# Patient Record
Sex: Female | Born: 1950 | Race: White | Hispanic: No | Marital: Married | Smoking: Former smoker
Health system: Southern US, Community
[De-identification: ages and names within clinical notes are randomized; demographics above are authoritative.]

## PROBLEM LIST (undated history)

## (undated) DIAGNOSIS — E7800 Pure hypercholesterolemia, unspecified: Secondary | ICD-10-CM

## (undated) DIAGNOSIS — I10 Essential (primary) hypertension: Secondary | ICD-10-CM

## (undated) DIAGNOSIS — C9140 Hairy cell leukemia not having achieved remission: Secondary | ICD-10-CM

## (undated) DIAGNOSIS — E119 Type 2 diabetes mellitus without complications: Secondary | ICD-10-CM

## (undated) DIAGNOSIS — Z9289 Personal history of other medical treatment: Secondary | ICD-10-CM

## (undated) HISTORY — PX: ABDOMINAL HYSTERECTOMY: SHX81

## (undated) HISTORY — PX: TONSILLECTOMY: SUR1361

## (undated) HISTORY — DX: Hairy cell leukemia not having achieved remission: C91.40

---

## 2004-08-23 ENCOUNTER — Encounter: Admission: RE | Admit: 2004-08-23 | Discharge: 2004-09-19 | Payer: Self-pay | Admitting: Family Medicine

## 2004-11-23 ENCOUNTER — Ambulatory Visit: Payer: Self-pay | Admitting: Hematology & Oncology

## 2005-05-09 ENCOUNTER — Other Ambulatory Visit: Admission: RE | Admit: 2005-05-09 | Discharge: 2005-05-09 | Payer: Self-pay | Admitting: Obstetrics and Gynecology

## 2005-06-18 ENCOUNTER — Inpatient Hospital Stay (HOSPITAL_COMMUNITY): Admission: RE | Admit: 2005-06-18 | Discharge: 2005-06-20 | Payer: Self-pay | Admitting: Obstetrics and Gynecology

## 2005-06-18 ENCOUNTER — Encounter (INDEPENDENT_AMBULATORY_CARE_PROVIDER_SITE_OTHER): Payer: Self-pay | Admitting: Specialist

## 2005-11-29 ENCOUNTER — Ambulatory Visit: Payer: Self-pay | Admitting: Hematology & Oncology

## 2006-11-28 ENCOUNTER — Ambulatory Visit: Payer: Self-pay | Admitting: Hematology & Oncology

## 2006-12-03 LAB — CBC WITH DIFFERENTIAL/PLATELET
BASO%: 0.2 % (ref 0.0–2.0)
EOS%: 1 % (ref 0.0–7.0)
HGB: 13.7 g/dL (ref 11.6–15.9)
MCH: 30.5 pg (ref 26.0–34.0)
MCHC: 34.1 g/dL (ref 32.0–36.0)
MONO#: 0.3 10*3/uL (ref 0.1–0.9)
RDW: 13 % (ref 11.3–14.5)
WBC: 6.7 10*3/uL (ref 3.9–10.0)
lymph#: 1.2 10*3/uL (ref 0.9–3.3)

## 2007-12-21 ENCOUNTER — Ambulatory Visit: Payer: Self-pay | Admitting: Hematology & Oncology

## 2007-12-23 LAB — CBC WITH DIFFERENTIAL/PLATELET
BASO%: 0.5 % (ref 0.0–2.0)
HCT: 40.9 % (ref 34.8–46.6)
LYMPH%: 21.9 % (ref 14.0–48.0)
MCH: 29.6 pg (ref 26.0–34.0)
MCHC: 33.8 g/dL (ref 32.0–36.0)
MCV: 87.8 fL (ref 81.0–101.0)
MONO%: 5.6 % (ref 0.0–13.0)
NEUT%: 70.1 % (ref 39.6–76.8)
Platelets: 230 10*3/uL (ref 145–400)
RBC: 4.66 10*6/uL (ref 3.70–5.32)

## 2008-11-25 ENCOUNTER — Ambulatory Visit: Payer: Self-pay | Admitting: Hematology & Oncology

## 2008-11-25 LAB — CBC WITH DIFFERENTIAL (CANCER CENTER ONLY)
BASO#: 0 10*3/uL (ref 0.0–0.2)
EOS%: 1.8 % (ref 0.0–7.0)
Eosinophils Absolute: 0.1 10*3/uL (ref 0.0–0.5)
HGB: 14.9 g/dL (ref 11.6–15.9)
LYMPH#: 1.2 10*3/uL (ref 0.9–3.3)
MONO#: 0.2 10*3/uL (ref 0.1–0.9)
NEUT#: 3.3 10*3/uL (ref 1.5–6.5)
RBC: 4.89 10*6/uL (ref 3.70–5.32)
WBC: 4.9 10*3/uL (ref 3.9–10.0)

## 2009-12-04 ENCOUNTER — Ambulatory Visit: Payer: Self-pay | Admitting: Hematology & Oncology

## 2009-12-05 LAB — CBC WITH DIFFERENTIAL (CANCER CENTER ONLY)
BASO#: 0 10*3/uL (ref 0.0–0.2)
EOS%: 1.4 % (ref 0.0–7.0)
HCT: 44.5 % (ref 34.8–46.6)
HGB: 15.1 g/dL (ref 11.6–15.9)
LYMPH%: 20.1 % (ref 14.0–48.0)
MCH: 30.8 pg (ref 26.0–34.0)
MCHC: 33.9 g/dL (ref 32.0–36.0)
MCV: 91 fL (ref 81–101)
MONO%: 4.3 % (ref 0.0–13.0)
NEUT#: 4 10*3/uL (ref 1.5–6.5)
NEUT%: 73.7 % (ref 39.6–80.0)

## 2010-12-07 ENCOUNTER — Ambulatory Visit: Payer: Self-pay | Admitting: Hematology & Oncology

## 2010-12-10 LAB — CBC WITH DIFFERENTIAL (CANCER CENTER ONLY)
BASO%: 0.6 % (ref 0.0–2.0)
LYMPH#: 1.2 10*3/uL (ref 0.9–3.3)
LYMPH%: 19.3 % (ref 14.0–48.0)
MCV: 90 fL (ref 81–101)
MONO#: 0.4 10*3/uL (ref 0.1–0.9)
Platelets: 205 10*3/uL (ref 145–400)
RDW: 10.9 % (ref 10.5–14.6)
WBC: 6 10*3/uL (ref 3.9–10.0)

## 2010-12-10 LAB — COMPREHENSIVE METABOLIC PANEL
ALT: 26 U/L (ref 0–35)
AST: 17 U/L (ref 0–37)
Alkaline Phosphatase: 83 U/L (ref 39–117)
Calcium: 9.2 mg/dL (ref 8.4–10.5)
Chloride: 96 mEq/L (ref 96–112)
Creatinine, Ser: 0.52 mg/dL (ref 0.40–1.20)

## 2010-12-10 LAB — CHCC SATELLITE - SMEAR

## 2011-04-05 NOTE — Op Note (Signed)
NAMEMarland Kitchen  Melanie, Atkins NO.:  [id number]   MEDICAL RECORD NO.:  [id number]          PATIENT TYPE:  INP   LOCATION:  9319                          FACILITY:  [facility]   PHYSICIAN:  Carrington Clamp, M.D. DATE OF BIRTH:  02-09-1951   DATE OF PROCEDURE:  06/18/2005  DATE OF DISCHARGE:                                 OPERATIVE REPORT   PREOPERATIVE DIAGNOSIS:  Symptomatic fibroids and menometrorrhagia.   POSTOPERATIVE DIAGNOSIS:  Symptomatic fibroids and menometrorrhagia.   OPERATION/PROCEDURE:  Total abdominal hysterectomy.   SURGEON:  Carrington Clamp, M.D.   ASSISTANT:  Luvenia Redden, M.D.   ANESTHESIA:  General.   SPECIMENS:  Uterus and cervix.   ESTIMATED BLOOD LOSS:  100 mL.   IV FLUIDS:  1400 mL.   URINARY OUTPUT:  150 mL.   COMPLICATIONS:  None.   FINDINGS:  A 17-week size uterus with fibroids, largest one in the fundus.  The uterus and cervix weighed 650 g.  There were normal ovaries although  there was a small amount of endometriosis in the posterior cul-de-sac along  the posterior cervix which the ovaries and tubes were adherent.  The ureters  were seen bilaterally post procedure.   MEDICATIONS:  Ancef and Gelfoam.   COUNTS:  Correct x3.   DESCRIPTION OF PROCEDURE:  After adequate general anesthesia was achieved,  the patient was prepped and draped in the usual sterile fashion.  Dorsal  supine position.  A Pfannenstiel skin incision was made with the scalpel and  carried down to the fascia with Bovie cautery.  The fascia was incised in  the midline with the scalpel and then carried in transverse curvilinear  manner with the Mayo scissors.  The fascia was directed superiorly and  inferiorly from rectus muscle and the rectus muscle split in the midline.  Bowel free portion of the peritoneum was then entered into bluntly and then  incised in the superior inferior manner with good visualization of the bowel  and bladder.   The  O'Connor-O'Sullivan retractor was placed and the bowel was packed away  with seven wet laps.  The uterus was grasped with a pair of Kelly clamps and  the round ligaments were grasped with the forceps.  Each round ligament was  secured with the suture transfixion stitch of 0 Vicryl and then was incised  with the Bovie cautery.  The Bovie cautery was used to incise the peritoneum  parallel to the infundibulopelvic ligament and then the anterior leaf of the  peritoneum and vesicouterine fascia.  Bladder flap was created with blunt  and sharp dissection until the bladder was out of field of dissection.  On  the left-hand side, avascular portion of the mesosalpinx was entered into  bluntly and then Heaney clamp was placed over the utero-ovarian ligament.  This was divided with a Mayo scissors and secured with the free hand tie of  0 Vicryl followed by a stitch of 0 Vicryl.  The same procedure was performed  on the right-hand side.  The uterine arteries were then skeletonized and  pair of Heaneys placed over  the uterine artery at  the level of the internal  os.  Each pedicle was incised with the Mayo scissors and secured with a  stitch of 0 Vicryl.  An additional bite with a straight Heaney clamp was  then performed and each pedicle was incised with a scalpel.  Each pedicle  was then secured with a stitch of 0 Vicryl.  The uterus was then amputated  at the level of the internal os with the scalpel.  The cervical stump was  grasped with a pair of Kochers and the remaining cardinal ligament was  divided with alternating successive bites with a straight Heaney clamp.  Each bite was divided with a scalpel and secured with a stitch of 0 Vicryl.  At the level of the reflection of the vagina to the cervix and the  uterosacrals, a pair of curved Heaneys was placed.  Each pedicle was incised  with the Mayo scissors and secured with the Heaney stitch of 0 Vicryl.  The  cervix was then amputated with  Jorgenson scissors and the remaining cuff  grasped with a pair of Kochers.  Two additional figure-of-eight stitches of  0 Vicryl were then placed and hemostasis was achieved.   There was some residual oozing in the cul-de-sac on the vaginal side where  the ovaries had been stuck.  Hemostasis was achieved with both Bovie cautery  and Gelfoam.  The ureters were then identified bilaterally and found to be  moving and well out of the field of dissection.  All instruments and laps  were then withdrawn from the abdomen and the peritoneum was closed with a  running stitch of 2-0 Vicryl.  There a small buttonhole, 1 cm, of the fascia  on the upper side of the fascia.  This was secured at the figure-of-eight  stitch of 0 Vicryl.  The rectus muscles were closed with the same peritoneal  stitch in a separate layer.  The fascia was then closed with a running  suture of 0 PDS loops.  The subcutaneous tissue was then irrigated and  closed with an interrupted 2-0 plain gut stitches.  The skin was closed with  staples.  The patient tolerated the procedure well and was returned to the  recovery room in stable condition.      Carrington Clamp, M.D.  Electronically Signed     MH/MEDQ  D:  06/18/2005  T:  06/18/2005  Job:  [id number]

## 2011-04-05 NOTE — Discharge Summary (Signed)
NAMEMarland Kitchen  Atkins, Melanie Atkins            ACCOUNT NO.:  [id number]   MEDICAL RECORD NO.:  [id number]          PATIENT TYPE:  INP   LOCATION:  9319                          [facility]ILITY[facility]  [facility]   PHYSICIAN:  Carrington Clamp, M.D. DATE OF BIRTH:  August 25, 1951   DATE OF ADMISSION:  06/18/2005  DATE OF DISCHARGE:  06/20/2005                                 DISCHARGE SUMMARY   ADMISSION DIAGNOSIS:  Symptomatic enlarged fibroid uterus with severe  menometrorrhagia.   DISCHARGE DIAGNOSIS:  Symptomatic enlarged fibroid uterus with severe  menometrorrhagia.   OPERATION/PROCEDURE:  Total abdominal hysterectomy.   PERTINENT TEST RESULTS:  Preoperative hemoglobin of 11.5, hematocrit 35.8.  Postoperative day hemoglobin 9.2, hematocrit 28.9.   HISTORY AND PHYSICAL:  This is a 60 year old, G1, P1 with significantly  enlarged fibroid uterus and severe menometrorrhagia.  The patient complained  of two periods per month and soaking through pads.  The patient complains of  watery discharge and uses a whole bag of overnight pads at a time.  This has  been happening for 10 years.  She did not skip any periods.  She has no  symptoms of impending menopause.   ALLERGIES:  No known drug allergies.   MEDICATIONS:  Benazepril/hydrochlorothiazide 20/25 one p.o. daily.   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   PAST SURGICAL HISTORY:  Term cesarean section.   GYN HISTORY:  No PID or abnormal Pap smears.   OB HISTORY:  Term cesarean section x1.   HABITS:  Tobacco:  None.   PHYSICAL EXAMINATION:  VITAL SIGNS: Blood pressure 140/88.  HEENT:  Anicteric without lymphadenopathy.  LUNGS:  Clear to auscultation bilaterally.  HEART:  Regular rate and rhythm.  ABDOMEN:  Soft, nontender, nondistended.  ADENOPATHY:  None.  GENITALIA:  Normal external genitalia and vagina.  Cervix was normal.  Uterus was about 17-week size and bulky.  Adnexa were benign.  RECTUM:   Benign.  EXTREMITIES:  Benign.  SKIN:  Benign.   LABORATORY DATA:  Ultrasound showed a 17 x 9 x 11 cm enlarged uterus with an  8.8 cm fibroid in the fundus.  Normal ovaries were seen.   ASSESSMENT:  The patient was to undergo a total abdominal hysterectomy.  The  patient wished to retain her ovaries unless they were frankly abnormal.  All  risks, benefits and alternatives were discussed with the patient.  The  patient was to receive preoperative antibiotics and SCDs during surgery.   HOSPITAL COURSE:  The patient underwent the above-named procedure on June 18, 2005 without complications.  She was eating, ambulating, voiding on  postoperative day #2.  Was sent home with the following:   ACTIVITY:  No driving for two weeks.  No lifting for six weeks.  No sexual  activity for six weeks.  No straining for six weeks.   WOUND CARE:  Keep dry.   DISCHARGE MEDICATIONS:  1.  Percocet 5 mg one p.o. q.4-6h. p.r.n. pain.  2.  Benazepril/hydrochlorothiazide 20/25 mg.  3.  Iron over-the-counter.  4.  Motrin or Advil over-the-counter.  5.  Colace over-the-counter.   FOLLOW UP:  Return to Dr. Henderson Cloud the following day for staples to be  removed.      Carrington Clamp, M.D.  Electronically Signed     MH/MEDQ  D:  07/24/2005  T:  07/25/2005  Job:  [id number]

## 2011-11-25 ENCOUNTER — Telehealth: Payer: Self-pay | Admitting: *Deleted

## 2011-11-25 NOTE — Telephone Encounter (Signed)
Pt called requesting a lab appt for this month as she promised her previous oncologist before she moved down here that she would have her labs checked once a year. Upon reviewing the chart, it was noted that she had an appt for 12/09/11. She stated she wasn't aware but could have forgotten. Explained that it was most likely made a year ago. When her appt times were confirmed she declined her appt with Dr Myna Hidalgo stating, "I will come in if there is something wrong with my labs but otherwise I normally just get a call". Ok to have a lab only appt. Will ask the schedulers to cx her MD appt.   Of note: she is scheduled for an appt to see a GI for screening colonoscopy.

## 2011-11-29 ENCOUNTER — Telehealth: Payer: Self-pay | Admitting: Hematology & Oncology

## 2011-11-29 NOTE — Telephone Encounter (Signed)
Pt called and cx 12/09/11 apt and resch the lab apt only for 12/10/11

## 2011-12-09 ENCOUNTER — Other Ambulatory Visit: Payer: Self-pay | Admitting: Lab

## 2011-12-09 ENCOUNTER — Ambulatory Visit: Payer: Self-pay | Admitting: Hematology & Oncology

## 2011-12-10 ENCOUNTER — Other Ambulatory Visit: Payer: 59 | Admitting: Lab

## 2011-12-10 LAB — CBC WITH DIFFERENTIAL (CANCER CENTER ONLY)
Eosinophils Absolute: 0.1 10*3/uL (ref 0.0–0.5)
HCT: 41.1 % (ref 34.8–46.6)
HGB: 14.5 g/dL (ref 11.6–15.9)
LYMPH%: 22.6 % (ref 14.0–48.0)
MCV: 88 fL (ref 81–101)
MONO#: 0.3 10*3/uL (ref 0.1–0.9)
Platelets: 166 10*3/uL (ref 145–400)
RBC: 4.65 10*6/uL (ref 3.70–5.32)
WBC: 4.5 10*3/uL (ref 3.9–10.0)

## 2011-12-11 LAB — SEDIMENTATION RATE: Sed Rate: 12 mm/hr (ref 0–22)

## 2012-12-16 ENCOUNTER — Telehealth: Payer: Self-pay | Admitting: Hematology & Oncology

## 2012-12-16 NOTE — Telephone Encounter (Signed)
Pt called wanted to make lab appointment only. Per Dr. Myna Hidalgo pt needs to be seen. Pt aware MD wants to see her, she said she wasn't sure why she has been doing this for 6 years or so. I explained that the MD needs to see her once in a while. I offered her several appointment dates but she decided 01-01-13 would work the best for her.

## 2013-01-01 ENCOUNTER — Other Ambulatory Visit: Payer: 59 | Admitting: Lab

## 2013-01-01 ENCOUNTER — Telehealth: Payer: Self-pay | Admitting: *Deleted

## 2013-01-01 ENCOUNTER — Ambulatory Visit: Payer: 59 | Admitting: Hematology & Oncology

## 2013-01-01 NOTE — Telephone Encounter (Signed)
Patient called and left message regarding her appts from [place] office. I have called her back, patient stated that "there's no you here." I have explained that the office was closed, but I offered her a lab appt her at the [place] office. Patient stated that " but I'm here and found someone to draw my labs in the [facility] in the [facility]." I told the patient I will call ber back. Message given to [facility]. No lab orders in the computer, we have asked the patient to reschedule.  JMW

## 2013-01-04 ENCOUNTER — Telehealth: Payer: Self-pay | Admitting: Hematology & Oncology

## 2013-01-04 NOTE — Telephone Encounter (Signed)
Pt cx 2-26 appointment

## 2013-01-04 NOTE — Telephone Encounter (Signed)
Pt called wanted to speak to Darl Pikes said she owed her an apology. I asked her to reschedule while I had her on the phone. She told me twice she would call back to reschedule. I transferred call to Darl Pikes.

## 2013-01-13 ENCOUNTER — Other Ambulatory Visit (HOSPITAL_BASED_OUTPATIENT_CLINIC_OR_DEPARTMENT_OTHER): Payer: 59 | Admitting: Lab

## 2013-01-13 ENCOUNTER — Ambulatory Visit (HOSPITAL_BASED_OUTPATIENT_CLINIC_OR_DEPARTMENT_OTHER): Payer: 59 | Admitting: Hematology & Oncology

## 2013-01-13 VITALS — BP 152/77 | HR 109 | Temp 97.9°F | Resp 16 | Ht 69.0 in | Wt 233.0 lb

## 2013-01-13 DIAGNOSIS — C9140 Hairy cell leukemia not having achieved remission: Secondary | ICD-10-CM

## 2013-01-13 LAB — CBC WITH DIFFERENTIAL (CANCER CENTER ONLY)
Eosinophils Absolute: 0.1 10*3/uL (ref 0.0–0.5)
LYMPH%: 24.4 % (ref 14.0–48.0)
MCV: 88 fL (ref 81–101)
MONO#: 0.4 10*3/uL (ref 0.1–0.9)
NEUT#: 4.2 10*3/uL (ref 1.5–6.5)
Platelets: 213 10*3/uL (ref 145–400)
RBC: 5 10*6/uL (ref 3.70–5.32)
WBC: 6.2 10*3/uL (ref 3.9–10.0)

## 2013-01-13 NOTE — Progress Notes (Signed)
This office note has been dictated.

## 2013-01-15 NOTE — Progress Notes (Signed)
CC:   Juluis Rainier, MD, Fax [phone] Carrington Clamp, M.D.  DIAGNOSIS:  Hairy cell leukemia, in clinical remission.  CURRENT THERAPY:  Observation.  INTERIM HISTORY:  Ms. Ramnath comes in for her followup.  She is doing well.  I think we can probably discharge her from the clinic after this visit.  She has been in remission now for about 12 years.  She is feeling well.  She is having no issues.  She is still working without any problems.  There are no fevers, sweats or chills.  There is no bony pain.  There is no bleeding.  There is no change in bowel or bladder habits.  PHYSICAL EXAMINATION:  General:  This is a well-developed, well- nourished white female in no obvious distress.  Vital signs: Temperature of 97.9, pulse 109, respiratory rate 18, blood pressure 152/77.  Weight is 233.  Head and neck:  Normocephalic, atraumatic skull.  There are no ocular or oral lesions.  There are no palpable cervical or supraclavicular lymph nodes.  Lungs:  Clear bilaterally. Cardiac:  Regular rate and rhythm with a normal S1 and S2.  There are no murmurs, rubs or bruits.  Abdomen:  Soft with good bowel sounds.  There is no palpable abdominal mass.  There is no fluid wave.  There is no palpable hepatosplenomegaly.  Extremities:  No clubbing, cyanosis or edema.  Skin:  No rashes, ecchymoses or petechiae.  LABORATORY STUDIES:  White cell count is 6.2, hemoglobin 15.4, hematocrit 44.2, platelet count 213.  White cell differential shows 68 segs, 25 lymphs.  Peripheral smear shows good maturation of her white blood cells.  I do not see any immature lymphoid cells.  I do not see any "hairy" lymphoid cells.  Red cells with no nucleated red blood cells.  I see no teardrop cells.  There is no rouleaux formation.  Platelets are adequate in number and size.  IMPRESSION:  Ms. Boss is a very nice 62 year old white female with a remote history of hairy cell leukemia.  She is cured.  I do  not think we need to get her back to see [place].  We are just not making any impact on her general medical care.  If there are any hematologic issues down the road, we will be more than happy to see her.    ______________________________ Josph Macho, M.D. PRE/MEDQ  D:  01/13/2013  T:  01/14/2013  Job:  [id number]

## 2013-11-29 ENCOUNTER — Telehealth: Payer: Self-pay | Admitting: Hematology & Oncology

## 2013-11-29 ENCOUNTER — Other Ambulatory Visit: Payer: Self-pay | Admitting: Nurse Practitioner

## 2013-11-29 DIAGNOSIS — C9140 Hairy cell leukemia not having achieved remission: Secondary | ICD-10-CM

## 2013-11-29 NOTE — Telephone Encounter (Signed)
Pt made 1-15 lab ok per RN

## 2013-12-02 ENCOUNTER — Telehealth: Payer: Self-pay | Admitting: Hematology & Oncology

## 2013-12-02 ENCOUNTER — Other Ambulatory Visit: Payer: 59 | Admitting: Lab

## 2013-12-02 NOTE — Telephone Encounter (Signed)
Pt moved 1-15 lab to 1-16

## 2013-12-03 ENCOUNTER — Other Ambulatory Visit (HOSPITAL_BASED_OUTPATIENT_CLINIC_OR_DEPARTMENT_OTHER): Payer: 59 | Admitting: Lab

## 2013-12-03 DIAGNOSIS — C9140 Hairy cell leukemia not having achieved remission: Secondary | ICD-10-CM

## 2013-12-03 LAB — CBC WITH DIFFERENTIAL (CANCER CENTER ONLY)
BASO#: 0 10*3/uL (ref 0.0–0.2)
BASO%: 0.5 % (ref 0.0–2.0)
EOS%: 0.9 % (ref 0.0–7.0)
Eosinophils Absolute: 0.1 10*3/uL (ref 0.0–0.5)
HCT: 43.5 % (ref 34.8–46.6)
HGB: 14.9 g/dL (ref 11.6–15.9)
LYMPH#: 1.4 10*3/uL (ref 0.9–3.3)
LYMPH%: 25.8 % (ref 14.0–48.0)
MCH: 30.3 pg (ref 26.0–34.0)
MCHC: 34.3 g/dL (ref 32.0–36.0)
MCV: 89 fL (ref 81–101)
MONO#: 0.4 10*3/uL (ref 0.1–0.9)
MONO%: 7.2 % (ref 0.0–13.0)
NEUT#: 3.7 10*3/uL (ref 1.5–6.5)
NEUT%: 65.6 % (ref 39.6–80.0)
PLATELETS: 233 10*3/uL (ref 145–400)
RBC: 4.91 10*6/uL (ref 3.70–5.32)
RDW: 12.3 % (ref 11.1–15.7)
WBC: 5.6 10*3/uL (ref 3.9–10.0)

## 2013-12-03 LAB — CHCC SATELLITE - SMEAR

## 2014-11-30 ENCOUNTER — Other Ambulatory Visit: Payer: Self-pay | Admitting: Hematology & Oncology

## 2014-11-30 ENCOUNTER — Telehealth: Payer: Self-pay | Admitting: Hematology & Oncology

## 2014-11-30 ENCOUNTER — Encounter: Payer: Self-pay | Admitting: Hematology & Oncology

## 2014-11-30 DIAGNOSIS — C9140 Hairy cell leukemia not having achieved remission: Secondary | ICD-10-CM

## 2014-11-30 HISTORY — DX: Hairy cell leukemia not having achieved remission: C91.40

## 2014-11-30 NOTE — Telephone Encounter (Signed)
Pt left message wants lab appointment, per MD that's fine. I left pt message to call for appointment

## 2014-11-30 NOTE — Telephone Encounter (Signed)
Pt aware of 1-20 lab

## 2014-12-07 ENCOUNTER — Other Ambulatory Visit (HOSPITAL_BASED_OUTPATIENT_CLINIC_OR_DEPARTMENT_OTHER): Payer: 59 | Admitting: Lab

## 2014-12-07 DIAGNOSIS — C9140 Hairy cell leukemia not having achieved remission: Secondary | ICD-10-CM

## 2014-12-07 LAB — CBC WITH DIFFERENTIAL (CANCER CENTER ONLY)
BASO#: 0 10*3/uL (ref 0.0–0.2)
BASO%: 0.5 % (ref 0.0–2.0)
EOS%: 1.6 % (ref 0.0–7.0)
Eosinophils Absolute: 0.1 10*3/uL (ref 0.0–0.5)
HCT: 41.7 % (ref 34.8–46.6)
HEMOGLOBIN: 14.3 g/dL (ref 11.6–15.9)
LYMPH#: 1.5 10*3/uL (ref 0.9–3.3)
LYMPH%: 26.7 % (ref 14.0–48.0)
MCH: 30.2 pg (ref 26.0–34.0)
MCHC: 34.3 g/dL (ref 32.0–36.0)
MCV: 88 fL (ref 81–101)
MONO#: 0.4 10*3/uL (ref 0.1–0.9)
MONO%: 7.3 % (ref 0.0–13.0)
NEUT#: 3.7 10*3/uL (ref 1.5–6.5)
NEUT%: 63.9 % (ref 39.6–80.0)
Platelets: 242 10*3/uL (ref 145–400)
RBC: 4.74 10*6/uL (ref 3.70–5.32)
RDW: 12.2 % (ref 11.1–15.7)
WBC: 5.8 10*3/uL (ref 3.9–10.0)

## 2014-12-07 LAB — CHCC SATELLITE - SMEAR

## 2015-01-12 ENCOUNTER — Other Ambulatory Visit: Payer: Self-pay | Admitting: Obstetrics and Gynecology

## 2015-01-13 LAB — CYTOLOGY - PAP

## 2017-02-06 ENCOUNTER — Other Ambulatory Visit: Payer: Self-pay | Admitting: Family Medicine

## 2017-02-06 ENCOUNTER — Ambulatory Visit
Admission: RE | Admit: 2017-02-06 | Discharge: 2017-02-06 | Disposition: A | Discharge status: Home / Self Care | Payer: Commercial Managed Care - HMO | Source: Ambulatory Visit | Attending: Family Medicine | Admitting: Family Medicine

## 2017-02-06 DIAGNOSIS — S0993XA Unspecified injury of face, initial encounter: Secondary | ICD-10-CM

## 2017-12-20 IMAGING — CT CT MAXILLOFACIAL W/O CM
1 series · 15 of 30 positions shown, 19 images · non-contrast
Comparison: None.

CLINICAL DATA: Pain following fall onto brick patio

EXAM:
CT MAXILLOFACIAL WITHOUT CONTRAST
TECHNIQUE: Multidetector CT imaging of the maxillofacial structures was
performed. Multiplanar CT image reconstructions were also generated.
A small metallic BB was placed on the right temple in order to
reliably differentiate right from left.

[Series 3: maxofacial soft · axial · 0.32mm/px · z∈[-200,+13]mm · 15 of 77 slices shown, 19 images]
[im 3/77  brain]
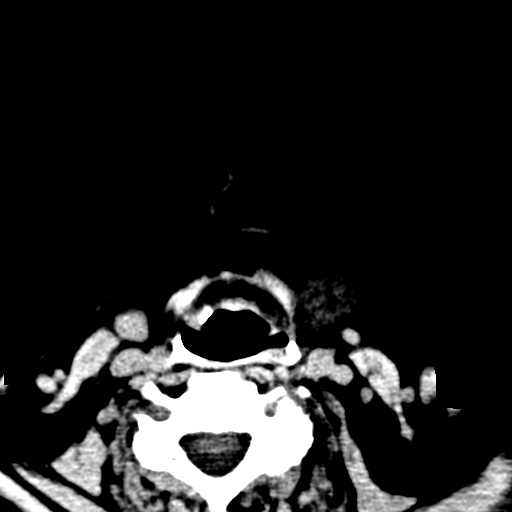
[im 3/77  bone]
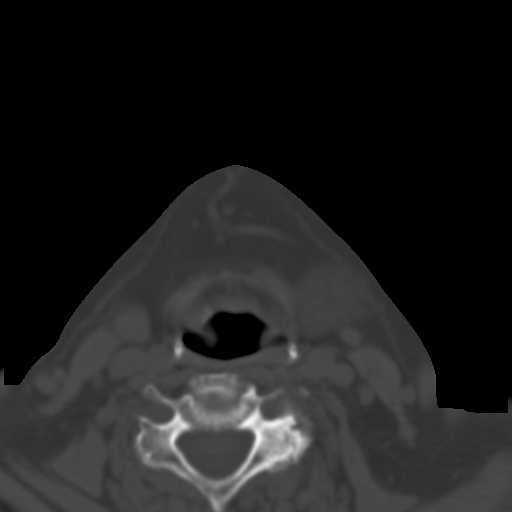
[im 8/77  bone]
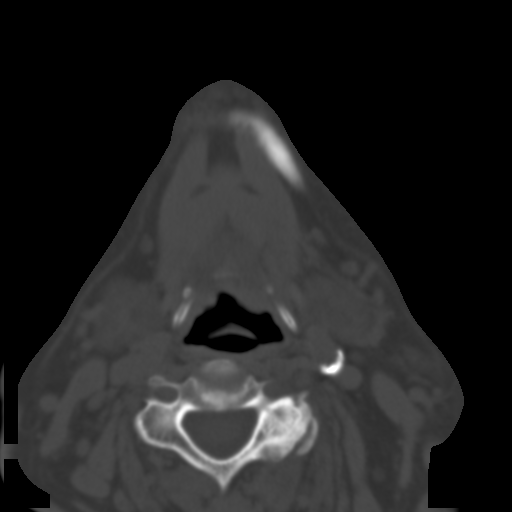
[im 14/77  bone]
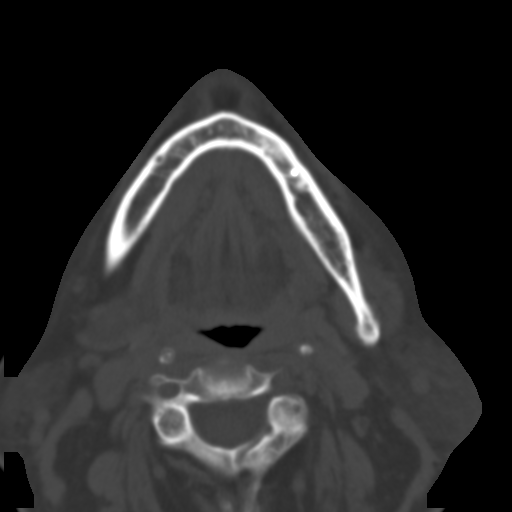
[im 19/77  bone]
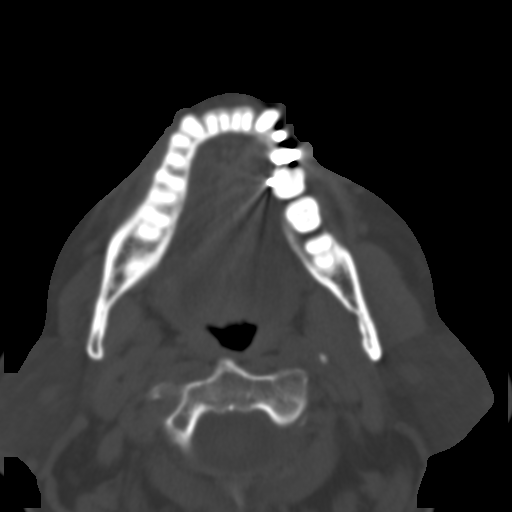
[im 24/77  brain]
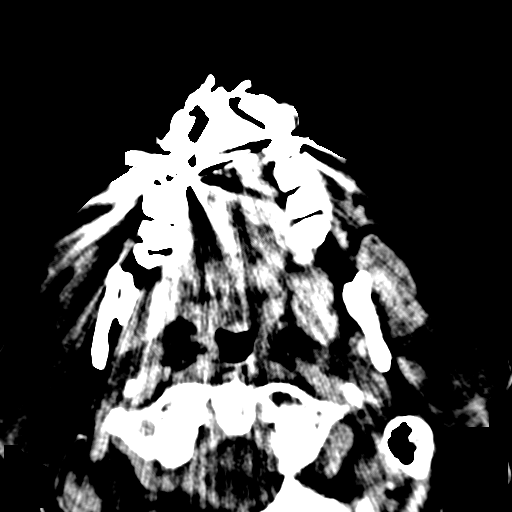
[im 24/77  bone]
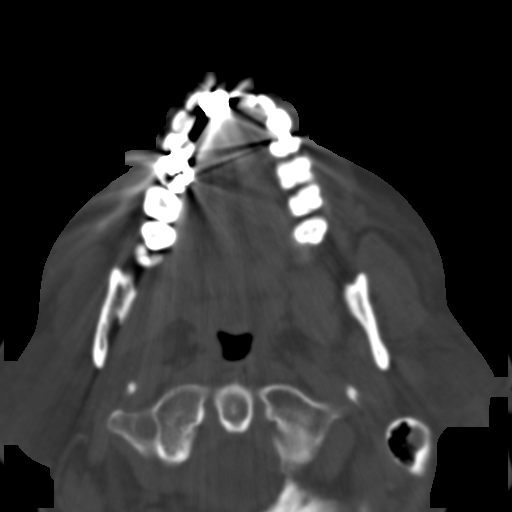
[im 29/77  bone]
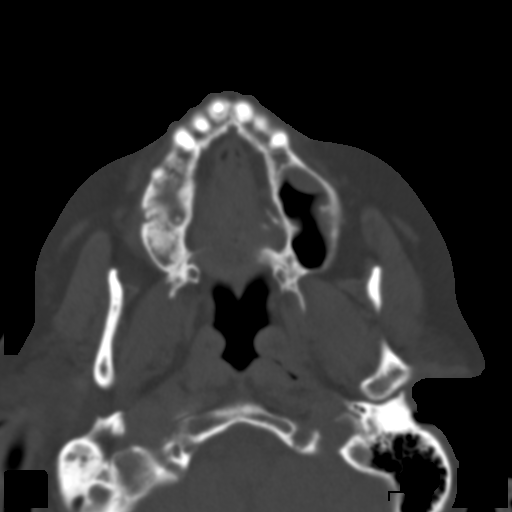
[im 35/77  bone]
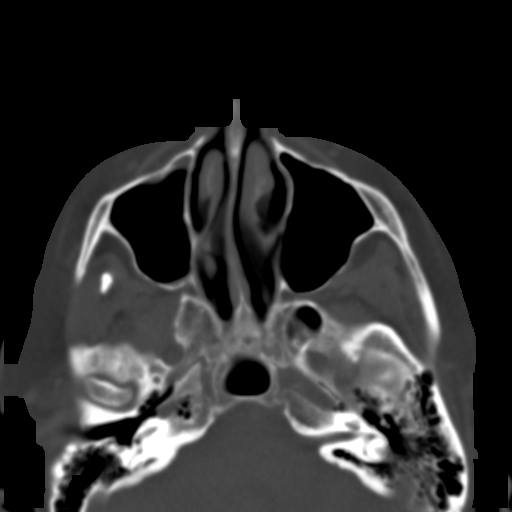
[im 40/77  bone]
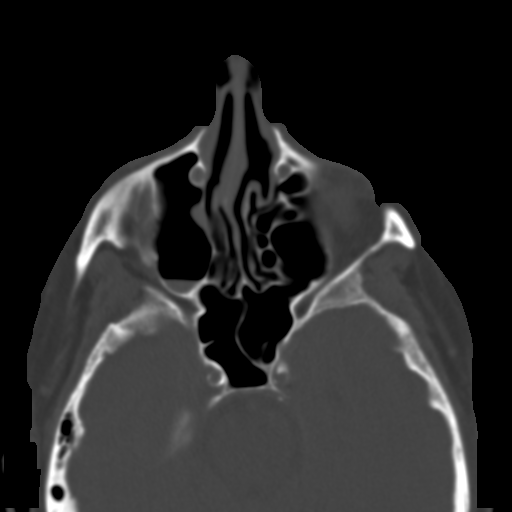
[im 42/77  brain]
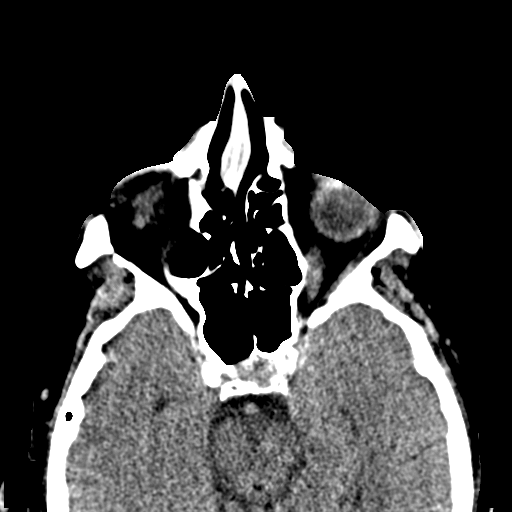
[im 42/77  bone]
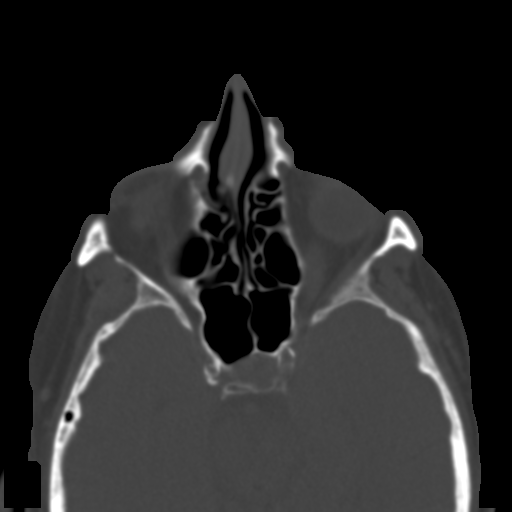
[im 48/77  bone]
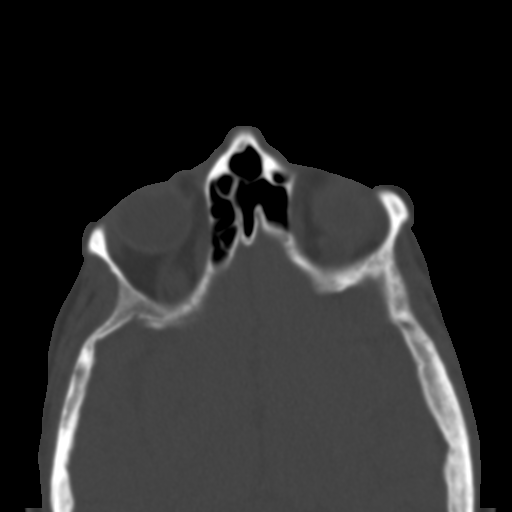
[im 53/77  bone]
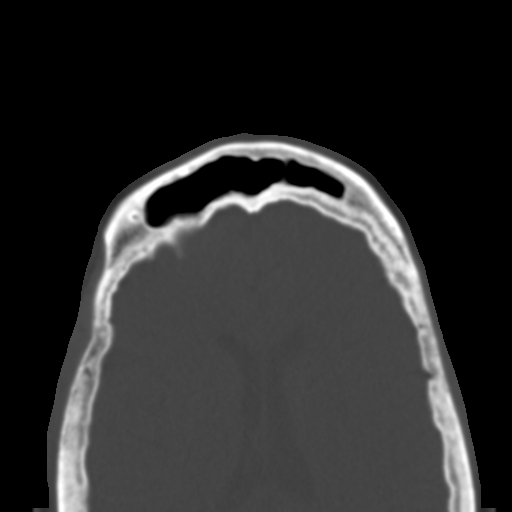
[im 58/77  bone]
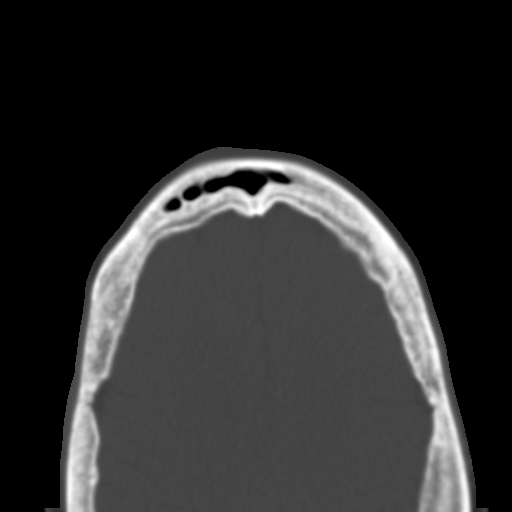
[im 63/77  brain]
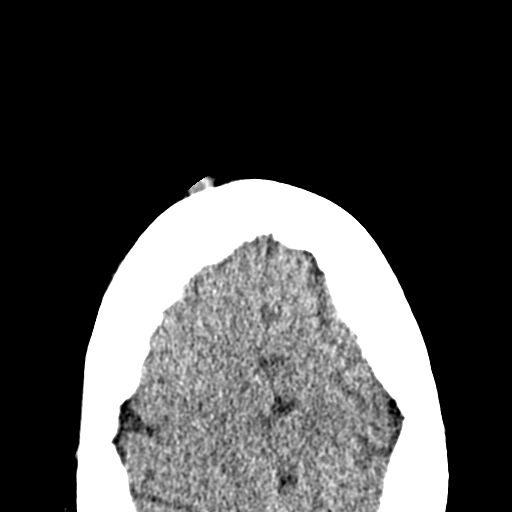
[im 63/77  bone]
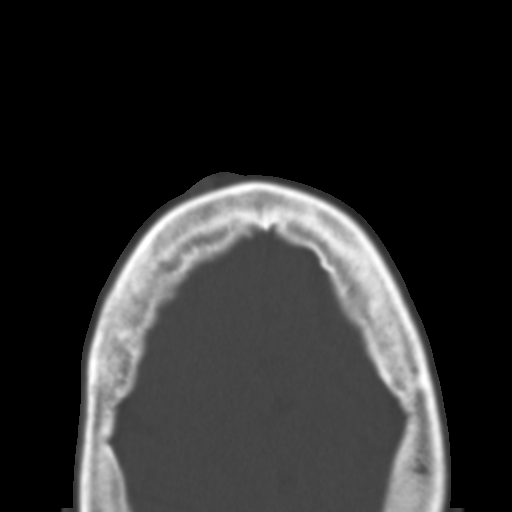
[im 69/77  bone]
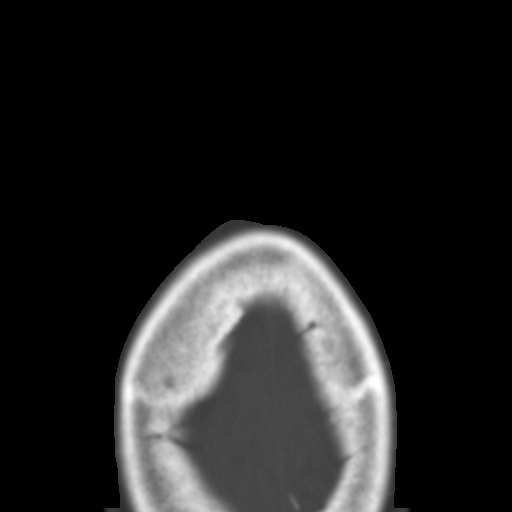
[im 74/77  bone]
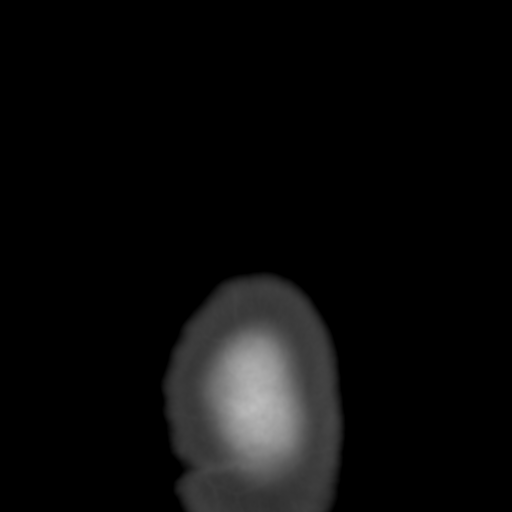

[15 of 30 positions shown; findings below may reference images not displayed]

FINDINGS: Osseous: There is no appreciable fracture or dislocation. There are
no blastic or lytic bone lesions.

Orbits: There is mild preseptal swelling over each orbit. There is
no intraorbital lesion. Globes appear intact bilaterally. No
intraorbital asymmetry or abnormal attenuation within either globe.

Sinuses: There is mucosal thickening in both maxillary antra. There
is a small air-fluid level in the right maxillary antrum. There is
mild mucosal thickening in several ethmoid air cells. No bony
destruction or expansion. There is rightward deviation of the nasal
septum. There is a bony spur arising from the rightward aspect of
the nasal septum. No nares obstruction. Ostiomeatal unit complexes
are patent bilaterally.

Soft tissues: There is soft tissue swelling over the upper face on
each side. No soft tissue mass or abscess evident. Salivary glands
appear symmetric bilaterally. No adenopathy. Tongue and tongue base
regions appear normal. Visualize pharynx appears normal.

Mastoid air cells which are visualized are clear.

Limited intracranial: There is a right frontal scalp hematoma more
superiorly. No calvarial fracture in visualized regions. Brain
parenchyma appears normal in visualized regions. Note that there is
extensive calcification in each carotid siphon region.
IMPRESSION: Areas of soft tissue swelling without fracture. Areas of paranasal
sinus disease with small air-fluid level in right maxillary antrum.
No intraorbital lesions. Visualized brain parenchyma appears normal.
Calcification in each carotid siphon region noted.

## 2018-01-27 ENCOUNTER — Inpatient Hospital Stay (HOSPITAL_COMMUNITY): Payer: 59

## 2018-01-27 ENCOUNTER — Encounter (HOSPITAL_COMMUNITY): Payer: Self-pay | Admitting: Family Medicine

## 2018-01-27 ENCOUNTER — Inpatient Hospital Stay (HOSPITAL_COMMUNITY)
Admission: AD | Admit: 2018-01-27 | Discharge: 2018-01-31 | DRG: 617 | Disposition: A | Discharge status: Home / Self Care | Payer: 59 | Source: Ambulatory Visit | Attending: Nephrology | Admitting: Nephrology

## 2018-01-27 DIAGNOSIS — M79674 Pain in right toe(s): Secondary | ICD-10-CM | POA: Diagnosis present

## 2018-01-27 DIAGNOSIS — Z90710 Acquired absence of both cervix and uterus: Secondary | ICD-10-CM | POA: Diagnosis not present

## 2018-01-27 DIAGNOSIS — I10 Essential (primary) hypertension: Secondary | ICD-10-CM | POA: Diagnosis present

## 2018-01-27 DIAGNOSIS — E1151 Type 2 diabetes mellitus with diabetic peripheral angiopathy without gangrene: Secondary | ICD-10-CM | POA: Diagnosis present

## 2018-01-27 DIAGNOSIS — Z6836 Body mass index (BMI) 36.0-36.9, adult: Secondary | ICD-10-CM | POA: Diagnosis not present

## 2018-01-27 DIAGNOSIS — E1165 Type 2 diabetes mellitus with hyperglycemia: Secondary | ICD-10-CM | POA: Diagnosis not present

## 2018-01-27 DIAGNOSIS — E7800 Pure hypercholesterolemia, unspecified: Secondary | ICD-10-CM | POA: Diagnosis present

## 2018-01-27 DIAGNOSIS — E1140 Type 2 diabetes mellitus with diabetic neuropathy, unspecified: Secondary | ICD-10-CM | POA: Diagnosis present

## 2018-01-27 DIAGNOSIS — E11628 Type 2 diabetes mellitus with other skin complications: Secondary | ICD-10-CM | POA: Diagnosis present

## 2018-01-27 DIAGNOSIS — E1169 Type 2 diabetes mellitus with other specified complication: Principal | ICD-10-CM | POA: Diagnosis present

## 2018-01-27 DIAGNOSIS — E669 Obesity, unspecified: Secondary | ICD-10-CM | POA: Diagnosis present

## 2018-01-27 DIAGNOSIS — Z87891 Personal history of nicotine dependence: Secondary | ICD-10-CM

## 2018-01-27 DIAGNOSIS — L03115 Cellulitis of right lower limb: Secondary | ICD-10-CM | POA: Diagnosis present

## 2018-01-27 DIAGNOSIS — Z856 Personal history of leukemia: Secondary | ICD-10-CM | POA: Diagnosis not present

## 2018-01-27 DIAGNOSIS — M869 Osteomyelitis, unspecified: Secondary | ICD-10-CM | POA: Diagnosis not present

## 2018-01-27 DIAGNOSIS — M86271 Subacute osteomyelitis, right ankle and foot: Secondary | ICD-10-CM

## 2018-01-27 DIAGNOSIS — M86371 Chronic multifocal osteomyelitis, right ankle and foot: Secondary | ICD-10-CM | POA: Diagnosis not present

## 2018-01-27 DIAGNOSIS — L97519 Non-pressure chronic ulcer of other part of right foot with unspecified severity: Secondary | ICD-10-CM | POA: Diagnosis present

## 2018-01-27 DIAGNOSIS — E11621 Type 2 diabetes mellitus with foot ulcer: Secondary | ICD-10-CM | POA: Diagnosis present

## 2018-01-27 DIAGNOSIS — L089 Local infection of the skin and subcutaneous tissue, unspecified: Secondary | ICD-10-CM | POA: Diagnosis not present

## 2018-01-27 DIAGNOSIS — Z7982 Long term (current) use of aspirin: Secondary | ICD-10-CM

## 2018-01-27 DIAGNOSIS — Z7984 Long term (current) use of oral hypoglycemic drugs: Secondary | ICD-10-CM | POA: Diagnosis not present

## 2018-01-27 HISTORY — DX: Type 2 diabetes mellitus without complications: E11.9

## 2018-01-27 HISTORY — DX: Personal history of other medical treatment: Z92.89

## 2018-01-27 HISTORY — DX: Essential (primary) hypertension: I10

## 2018-01-27 HISTORY — DX: Pure hypercholesterolemia, unspecified: E78.00

## 2018-01-27 LAB — GLUCOSE, CAPILLARY
GLUCOSE-CAPILLARY: 184 mg/dL — AB (ref 65–99)
GLUCOSE-CAPILLARY: 202 mg/dL — AB (ref 65–99)

## 2018-01-27 MED ORDER — ONDANSETRON HCL 4 MG PO TABS: 4.0000 mg | ORAL_TABLET | Freq: Four times a day (QID) | ORAL | Status: DC | PRN

## 2018-01-27 MED ORDER — ACETAMINOPHEN 325 MG PO TABS: 650.0000 mg | ORAL_TABLET | Freq: Four times a day (QID) | ORAL | Status: DC | PRN

## 2018-01-27 MED ORDER — ONDANSETRON HCL 4 MG/2ML IJ SOLN: 4.0000 mg | Freq: Four times a day (QID) | INTRAMUSCULAR | Status: DC | PRN

## 2018-01-27 MED ORDER — SENNOSIDES-DOCUSATE SODIUM 8.6-50 MG PO TABS: 1.0000 | ORAL_TABLET | Freq: Every evening | ORAL | Status: DC | PRN

## 2018-01-27 MED ORDER — ACETAMINOPHEN 650 MG RE SUPP: 650.0000 mg | Freq: Four times a day (QID) | RECTAL | Status: DC | PRN

## 2018-01-27 MED ORDER — OMEGA-3-ACID ETHYL ESTERS 1 G PO CAPS
1.0000 g | ORAL_CAPSULE | Freq: Every day | ORAL | Status: DC
  Administered 2018-01-28 – 2018-01-31 (×3): 1 g via ORAL
  Filled 2018-01-27 (×3): qty 1

## 2018-01-27 MED ORDER — INSULIN ASPART 100 UNIT/ML ~~LOC~~ SOLN
0.0000 [IU] | SUBCUTANEOUS | Status: DC
  Administered 2018-01-28 (×2): 2 [IU] via SUBCUTANEOUS
  Administered 2018-01-28: 3 [IU] via SUBCUTANEOUS
  Administered 2018-01-28: 2 [IU] via SUBCUTANEOUS

## 2018-01-27 MED ORDER — HYDROCODONE-ACETAMINOPHEN 5-325 MG PO TABS: 1.0000 | ORAL_TABLET | ORAL | Status: DC | PRN

## 2018-01-27 MED ORDER — HYDRALAZINE HCL 20 MG/ML IJ SOLN: 10.0000 mg | INTRAMUSCULAR | Status: DC | PRN

## 2018-01-27 NOTE — H&P (Addendum)
History and Physical    Melanie Atkins NTZ:[id number] DOB: October 11, 1951 DOA: 01/27/2018  PCP: Aretta Nip, MD   Patient coming from: Home, by way of PCP office   Chief Complaint: Foot ulcer with swelling and drainage   HPI: Melanie Atkins is a 67 y.o. female with medical history significant for hypertension and  type 2 diabetes mellitus, now presenting to the hospital as a direct admission from her PCP clinic where she was evaluated for toe ulcer.  Patient reports that she had been in her usual state of health until noticing a wound on her right second toe approximately 2 weeks ago.  Since that time, there has been increasing swelling, erythema, and drainage.  She denies fevers or chills.  Denies any other wounds or rash.  She has a diabetic neuropathy and denies pain.  She was evaluated at her PCPs clinic, afebrile and hemodynamically stable, and direct admission was arranged for further evaluation and management of diabetic foot wound with concern for underlying osteomyelitis.   Review of Systems:  All other systems reviewed and apart from HPI, are negative.  Past Medical History:  Diagnosis Date  . Diabetes mellitus without complication ([place])   . Hairy cell leukemia ([place]) 11/30/2014  . Hypertension     History reviewed. No pertinent surgical history.   has no tobacco, alcohol, and drug history on file.  No Known Allergies  History reviewed. No pertinent family history.   Prior to Admission medications   Medication Sig Start Date End Date Taking? Authorizing Provider  benazepril-hydrochlorthiazide (LOTENSIN HCT) 20-25 MG per tablet Take 1 tablet by mouth daily.  11/26/12   [provider]  GLUCOS-CHONDROIT-CA-MG-C-D PO Take by mouth every morning.    [provider]  METFORMIN HCL PO Take by mouth every morning.    [provider]  Omega-3 Fatty Acids (FISH OIL) 1000 MG CAPS Take by mouth every morning.    [provider]     Physical Exam: Vitals:   01/27/18 2243  BP: (!) 105/55  Pulse: 88  Resp: 18  Temp: 98.4 F (36.9 C)  TempSrc: Oral  SpO2: 96%  Weight: 110.1 kg (242 lb 11.6 oz)  Height: '5\' 8"'  (1.727 m)      Constitutional: NAD, calm, obese Eyes: PERTLA, lids and conjunctivae normal ENMT: Mucous membranes are moist. Posterior pharynx clear of any exudate or lesions.   Neck: normal, supple, no masses, no thyromegaly Respiratory: clear to auscultation bilaterally, no wheezing, no crackles. Normal respiratory effort.   Cardiovascular: S1 & S2 heard, regular rate and rhythm. 1+ pretibial edema bilaterally. No significant JVD. Abdomen: No distension, no tenderness, no masses palpated. Bowel sounds normal.  Musculoskeletal: no clubbing / cyanosis. No joint deformity upper and lower extremities. Right foot findings below.  Skin: second toe on right has ulceration at lateral aspect and distal tip with edema, surrounding erythema, and thick white drainage; there is interdigital maceration. Skin otherwise warm, dry, well-perfused. Neurologic: CN 2-12 grossly intact. Strength 5/5 in all 4 limbs.  Psychiatric: Alert and oriented x 3. Calm, cooperative.      Labs on Admission: I have personally reviewed following labs and imaging studies  CBC: No results for input(s): WBC, NEUTROABS, HGB, HCT, MCV, PLT in the last 168 hours. Basic Metabolic Panel: No results for input(s): NA, K, CL, CO2, GLUCOSE, BUN, CREATININE, CALCIUM, MG, PHOS in the last 168 hours. GFR: CrCl cannot be calculated (Patient's most recent lab result is older than the maximum 21 days allowed.).  Liver Function Tests: No results for input(s): AST, ALT, ALKPHOS, BILITOT, PROT, ALBUMIN in the last 168 hours. No results for input(s): LIPASE, AMYLASE in the last 168 hours. No results for input(s): AMMONIA in the last 168 hours. Coagulation Profile: No results for input(s): INR, PROTIME in the last 168 hours. Cardiac Enzymes: No  results for input(s): CKTOTAL, CKMB, CKMBINDEX, TROPONINI in the last 168 hours. BNP (last 3 results) No results for input(s): PROBNP in the last 8760 hours. HbA1C: No results for input(s): HGBA1C in the last 72 hours. CBG: No results for input(s): GLUCAP in the last 168 hours. Lipid Profile: No results for input(s): CHOL, HDL, LDLCALC, TRIG, CHOLHDL, LDLDIRECT in the last 72 hours. Thyroid Function Tests: No results for input(s): TSH, T4TOTAL, FREET4, T3FREE, THYROIDAB in the last 72 hours. Anemia Panel: No results for input(s): VITAMINB12, FOLATE, FERRITIN, TIBC, IRON, RETICCTPCT in the last 72 hours. Urine analysis: No results found for: COLORURINE, APPEARANCEUR, LABSPEC, PHURINE, GLUCOSEU, HGBUR, BILIRUBINUR, KETONESUR, PROTEINUR, UROBILINOGEN, NITRITE, LEUKOCYTESUR Sepsis Labs: '@LABRCNTIP' (procalcitonin:4,lacticidven:4) )No results found for this or any previous visit (from the past 240 hour(s)).   Radiological Exams on Admission: No results found.  EKG: Not performed.   Assessment/Plan   1. Diabetic foot infection  - Presents with ulceration to right second toe with increasing edema, surrounding erythema, and thick white/yellow drainage  - She is afebrile on arrival  - Concern for underlying osteomyelitis  - Check CBC, ESR, CRP, and lactic acid; collect cultures; start with plain films and consider advanced imaging as needed  - Check ABI given uncontrolled DM   - Follow-up pending labs and consider holding abx to increase yield of surgical cultures    2. Type II DM  - A1c reportedly ~9%  - Managed at home with metformin, held on admission  - Follow CBG's and start SSI with Novolog while in hospital    3. Hypertension  - BP 105/55 initially, will hold her benazepril-HCTZ initially    DVT prophylaxis: SCD's  Code Status: Full  Family Communication: Discussed with patient  Consults called: None Admission status: Inpatient    Vianne Bulls, MD [facility] Pager [phone]  If 7PM-7AM, please contact night-coverage [email or web address] Password [facility]  01/27/2018, 11:19 PM

## 2018-01-28 ENCOUNTER — Inpatient Hospital Stay (HOSPITAL_COMMUNITY): Payer: 59

## 2018-01-28 ENCOUNTER — Encounter (HOSPITAL_COMMUNITY): Payer: Self-pay | Admitting: General Practice

## 2018-01-28 ENCOUNTER — Other Ambulatory Visit: Payer: Self-pay

## 2018-01-28 DIAGNOSIS — E11628 Type 2 diabetes mellitus with other skin complications: Secondary | ICD-10-CM

## 2018-01-28 DIAGNOSIS — M869 Osteomyelitis, unspecified: Secondary | ICD-10-CM

## 2018-01-28 LAB — CBC WITH DIFFERENTIAL/PLATELET
BASOS PCT: 0 %
Basophils Absolute: 0 10*3/uL (ref 0.0–0.1)
Eosinophils Absolute: 0.1 10*3/uL (ref 0.0–0.7)
Eosinophils Relative: 2 %
HCT: 38.2 % (ref 36.0–46.0)
Hemoglobin: 12.6 g/dL (ref 12.0–15.0)
Lymphocytes Relative: 29 %
Lymphs Abs: 2 10*3/uL (ref 0.7–4.0)
MCH: 29.5 pg (ref 26.0–34.0)
MCHC: 33 g/dL (ref 30.0–36.0)
MCV: 89.5 fL (ref 78.0–100.0)
MONO ABS: 0.5 10*3/uL (ref 0.1–1.0)
MONOS PCT: 8 %
Neutro Abs: 4.3 10*3/uL (ref 1.7–7.7)
Neutrophils Relative %: 61 %
Platelets: 338 10*3/uL (ref 150–400)
RBC: 4.27 MIL/uL (ref 3.87–5.11)
RDW: 12.7 % (ref 11.5–15.5)
WBC: 6.9 10*3/uL (ref 4.0–10.5)

## 2018-01-28 LAB — COMPREHENSIVE METABOLIC PANEL
ALK PHOS: 102 U/L (ref 38–126)
ALT: 30 U/L (ref 14–54)
AST: 25 U/L (ref 15–41)
Albumin: 3 g/dL — ABNORMAL LOW (ref 3.5–5.0)
Anion gap: 15 (ref 5–15)
BUN: 12 mg/dL (ref 6–20)
CALCIUM: 9.3 mg/dL (ref 8.9–10.3)
CO2: 25 mmol/L (ref 22–32)
Chloride: 96 mmol/L — ABNORMAL LOW (ref 101–111)
Creatinine, Ser: 0.64 mg/dL (ref 0.44–1.00)
GFR calc non Af Amer: >60 mL/min (ref >60)
Glucose, Bld: 180 mg/dL — ABNORMAL HIGH (ref 65–99)
Potassium: 3.7 mmol/L (ref 3.5–5.1)
SODIUM: 136 mmol/L (ref 135–145)
Total Bilirubin: 0.6 mg/dL (ref 0.3–1.2)
Total Protein: 7 g/dL (ref 6.5–8.1)

## 2018-01-28 LAB — GLUCOSE, CAPILLARY
GLUCOSE-CAPILLARY: 256 mg/dL — AB (ref 65–99)
Glucose-Capillary: 183 mg/dL — ABNORMAL HIGH (ref 65–99)
Glucose-Capillary: 191 mg/dL — ABNORMAL HIGH (ref 65–99)
Glucose-Capillary: 212 mg/dL — ABNORMAL HIGH (ref 65–99)
Glucose-Capillary: 227 mg/dL — ABNORMAL HIGH (ref 65–99)

## 2018-01-28 LAB — HEMOGLOBIN A1C
Hgb A1c MFr Bld: 9.5 % — ABNORMAL HIGH (ref 4.8–5.6)
MEAN PLASMA GLUCOSE: 225.95 mg/dL

## 2018-01-28 LAB — LACTIC ACID, PLASMA
LACTIC ACID, VENOUS: 2.3 mmol/L — AB (ref 0.5–1.9)
LACTIC ACID, VENOUS: 1.1 mmol/L (ref 0.5–1.9)

## 2018-01-28 LAB — C-REACTIVE PROTEIN: CRP: 6.5 mg/dL — ABNORMAL HIGH (ref <1.0)

## 2018-01-28 LAB — SEDIMENTATION RATE: Sed Rate: 92 mm/hr — ABNORMAL HIGH (ref 0–22)

## 2018-01-28 LAB — HIV ANTIBODY (ROUTINE TESTING W REFLEX): HIV Screen 4th Generation wRfx: NONREACTIVE

## 2018-01-28 LAB — PREALBUMIN: Prealbumin: 17.5 mg/dL — ABNORMAL LOW (ref 18–38)

## 2018-01-28 MED ORDER — SODIUM CHLORIDE 0.9 % IV BOLUS (SEPSIS)
500.0000 mL | Freq: Once | INTRAVENOUS | Status: AC
  Administered 2018-01-28: 500 mL via INTRAVENOUS

## 2018-01-28 MED ORDER — POTASSIUM CHLORIDE IN NACL 20-0.9 MEQ/L-% IV SOLN
INTRAVENOUS | Status: AC
  Administered 2018-01-28: 05:00:00 via INTRAVENOUS
  Filled 2018-01-28: qty 1000

## 2018-01-28 MED ORDER — BENAZEPRIL HCL 5 MG PO TABS
20.0000 mg | ORAL_TABLET | Freq: Every day | ORAL | Status: DC
  Administered 2018-01-28 – 2018-01-31 (×3): 20 mg via ORAL
  Filled 2018-01-28 (×4): qty 1
  Filled 2018-01-28: qty 4

## 2018-01-28 MED ORDER — INSULIN DETEMIR 100 UNIT/ML ~~LOC~~ SOLN
10.0000 [IU] | Freq: Every day | SUBCUTANEOUS | Status: DC
  Administered 2018-01-28: 10 [IU] via SUBCUTANEOUS
  Filled 2018-01-28 (×2): qty 0.1

## 2018-01-28 MED ORDER — PREMIER PROTEIN SHAKE
11.0000 [oz_av] | Freq: Two times a day (BID) | ORAL | Status: DC
  Administered 2018-01-28 – 2018-01-29 (×3): 11 [oz_av] via ORAL
  Filled 2018-01-28 (×10): qty 325.31

## 2018-01-28 MED ORDER — HYDROCHLOROTHIAZIDE 25 MG PO TABS
25.0000 mg | ORAL_TABLET | Freq: Every day | ORAL | Status: DC
  Administered 2018-01-29: 25 mg via ORAL
  Filled 2018-01-28: qty 1

## 2018-01-28 MED ORDER — BENAZEPRIL-HYDROCHLOROTHIAZIDE 20-25 MG PO TABS: 1.0000 | ORAL_TABLET | Freq: Every day | ORAL | Status: DC

## 2018-01-28 MED ORDER — INSULIN ASPART 100 UNIT/ML ~~LOC~~ SOLN
0.0000 [IU] | Freq: Every day | SUBCUTANEOUS | Status: DC
  Administered 2018-01-28 – 2018-01-30 (×2): 3 [IU] via SUBCUTANEOUS

## 2018-01-28 MED ORDER — INSULIN ASPART 100 UNIT/ML ~~LOC~~ SOLN
0.0000 [IU] | Freq: Three times a day (TID) | SUBCUTANEOUS | Status: DC
  Administered 2018-01-28 – 2018-01-29 (×3): 5 [IU] via SUBCUTANEOUS
  Administered 2018-01-29: 8 [IU] via SUBCUTANEOUS
  Administered 2018-01-30: 3 [IU] via SUBCUTANEOUS
  Administered 2018-01-31: 5 [IU] via SUBCUTANEOUS

## 2018-01-28 NOTE — Progress Notes (Signed)
Initial Nutrition Assessment  DOCUMENTATION CODES:   Obesity unspecified  INTERVENTION:  Premier Protein BID, each supplement provides 160 calories and 30 grams of protein  NUTRITION DIAGNOSIS:   Increased nutrient needs related to wound healing as evidenced by estimated needs.  GOAL:   Patient will meet greater than or equal to 90% of their needs  MONITOR:   Supplement acceptance, I & O's, Labs, PO intake, Skin, Weight trends  REASON FOR ASSESSMENT:   Consult Wound healing  ASSESSMENT:   Melanie Atkins is a 67 y.o. female who presents with over a 2-week history of ulcerations swelling draining right foot second toe. Plan for second ray amputation of R foot on friday  Spoke with Melanie Atkins at bedside. She reports good appetite and PO intake, eating 3 meals a day PTA, no weight loss. She is open to consuming premier BID to help with wound healing. No other needs. Monitor PO following surgery.  Labs reviewed:  CBGs 212, 191, 183  Medications reviewed and include:  Insulin, Omega 3s,   NUTRITION - FOCUSED PHYSICAL EXAM:    Most Recent Value  Orbital Region  No depletion  Upper Arm Region  No depletion  Thoracic and Lumbar Region  No depletion  Buccal Region  No depletion  Temple Region  No depletion  Clavicle Bone Region  No depletion  Clavicle and Acromion Bone Region  No depletion  Scapular Bone Region  No depletion  Dorsal Hand  No depletion  Patellar Region  No depletion  Anterior Thigh Region  No depletion  Posterior Calf Region  No depletion  Edema (RD Assessment)  None       Diet Order:  Diet Carb Modified Fluid consistency: Thin; Room service appropriate? Yes  EDUCATION NEEDS:   Education needs have been addressed  Skin:  Skin Assessment: Skin Integrity Issues: Skin Integrity Issues:: Diabetic Ulcer Diabetic Ulcer: R toe  Last BM:  01/27/2018  Height:   Ht Readings from Last 1 Encounters:  01/27/18 5\' 8"  (1.727 m)    Weight:   Wt  Readings from Last 1 Encounters:  01/27/18 242 lb 11.6 oz (110.1 kg)    Ideal Body Weight:  63.63 kg  BMI:  Body mass index is 36.91 kg/m.  Estimated Nutritional Needs:   Kcal:  9735-3299 calories (IBW x25-30)  Protein:  90-110 grams  Fluid:  1.9-2.2L  Satira Anis. Paisley Grajeda, MS, RD LDN Inpatient Clinical Dietitian Pager [phone]

## 2018-01-28 NOTE — Consult Note (Signed)
Reason for Consult:Toe ulcer Referring Physician: N Dhungel  [name] is an 67 y.o. female with DM and HTN. HPI: Melanie Atkins has been suffering with a right 2nd toe ulcer for about 2 weeks. At some point it started to have some discharge. She denies any pain or previous episodes of foot ulcers. She did have one day where she felt rundown and had a fever of 100 but otherwise no other constitutional symptoms of infection. Her blood sugars run in the upper 100's at home.  Past Medical History:  Diagnosis Date  . Diabetes mellitus without complication ([place])   . 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 Hypertension     History reviewed. No pertinent surgical history.  History reviewed. No pertinent family history.  Social History:  has no tobacco, alcohol, and drug history on file.  Allergies: No Known Allergies  Medications: I have reviewed the patient's current medications.  Results for orders placed or performed during the hospital encounter of 01/27/18 (from the past 48 hour(s))  Glucose, capillary     Status: Abnormal   Collection Time: 01/27/18 10:48 PM  Result Value Ref Range   Glucose-Capillary 202 (H) 65 - 99 mg/dL  Glucose, capillary     Status: Abnormal   Collection Time: 01/27/18 11:50 PM  Result Value Ref Range   Glucose-Capillary 184 (H) 65 - 99 mg/dL  Hemoglobin A1c     Status: Abnormal   Collection Time: 01/28/18  1:02 AM  Result Value Ref Range   Hgb A1c MFr Bld 9.5 (H) 4.8 - 5.6 %    Comment: (NOTE) Pre diabetes:          5.7%-6.4% Diabetes:              >6.4% Glycemic control for   <7.0% adults with diabetes    Mean Plasma Glucose 225.95 mg/dL    Comment: Performed at [facility], [place] [address] [place], [place] [address]  Sedimentation rate     Status: Abnormal   Collection Time: 01/28/18  1:02 AM  Result Value Ref Range   Sed Rate 92 (H) 0 - 22 mm/hr    Comment: Performed at [facility] [address], [place], [place] [address]   C-reactive protein     Status: Abnormal   Collection Time: 01/28/18  1:02 AM  Result Value Ref Range   CRP 6.5 (H) <1.0 mg/dL    Comment: Performed at [facility] [address] [place], [place] [address]  Prealbumin     Status: Abnormal   Collection Time: 01/28/18  1:02 AM  Result Value Ref Range   Prealbumin 17.5 (L) 18 - 38 mg/dL    Comment: Performed at [facility] [address], [place], [place] [address]  Comprehensive metabolic panel     Status: Abnormal   Collection Time: 01/28/18  1:02 AM  Result Value Ref Range   Sodium 136 135 - 145 mmol/L   Potassium 3.7 3.5 - 5.1 mmol/L   Chloride 96 (L) 101 - 111 mmol/L   CO2 25 22 - 32 mmol/L   Glucose, Bld 180 (H) 65 - 99 mg/dL   BUN 12 6 - 20 mg/dL   Creatinine, Ser 0.64 0.44 - 1.00 mg/dL   Calcium 9.3 8.9 - 10.3 mg/dL   Total Protein 7.0 6.5 - 8.1 g/dL   Albumin 3.0 (L) 3.5 - 5.0 g/dL   AST 25 15 - 41 U/L   ALT 30 14 -  54 U/L   Alkaline Phosphatase 102 38 - 126 U/L   Total Bilirubin 0.6 0.3 - 1.2 mg/dL   GFR calc non Af Amer >60 >60 mL/min   GFR calc Af Amer >60 >60 mL/min    Comment: (NOTE) The eGFR has been calculated using the CKD EPI equation. This calculation has not been validated in all clinical situations. eGFR's persistently <60 mL/min signify possible Chronic Kidney Disease.    Anion gap 15 5 - 15    Comment: Performed at [facility] [address] [place], [place] [address]  CBC WITH DIFFERENTIAL     Status: None   Collection Time: 01/28/18  1:02 AM  Result Value Ref Range   WBC 6.9 4.0 - 10.5 K/uL   RBC 4.27 3.87 - 5.11 MIL/uL   Hemoglobin 12.6 12.0 - 15.0 g/dL   HCT 38.2 36.0 - 46.0 %   MCV 89.5 78.0 - 100.0 fL   MCH 29.5 26.0 - 34.0 pg   MCHC 33.0 30.0 - 36.0 g/dL   RDW 12.7 11.5 - 15.5 %   Platelets 338 150 - 400 K/uL   Neutrophils Relative % 61 %   Neutro Abs 4.3 1.7 - 7.7 K/uL   Lymphocytes Relative 29 %   Lymphs Abs 2.0 0.7 - 4.0 K/uL   Monocytes Relative 8 %    Monocytes Absolute 0.5 0.1 - 1.0 K/uL   Eosinophils Relative 2 %   Eosinophils Absolute 0.1 0.0 - 0.7 K/uL   Basophils Relative 0 %   Basophils Absolute 0.0 0.0 - 0.1 K/uL    Comment: Performed at [facility] [address], [place], [place] [address]  Lactic acid, plasma     Status: Abnormal   Collection Time: 01/28/18  1:02 AM  Result Value Ref Range   Lactic Acid, Venous 2.3 (HH) 0.5 - 1.9 mmol/L    Comment: CRITICAL RESULT CALLED TO, READ BACK BY AND VERIFIED WITH: C COBLE,RN 0150 01/28/18 D BRADLEY Performed at [facility] [address] [place], [place] [address]   Lactic acid, plasma     Status: None   Collection Time: 01/28/18  4:16 AM  Result Value Ref Range   Lactic Acid, Venous 1.1 0.5 - 1.9 mmol/L    Comment: Performed at [facility] [address] [place], [place] [address]  Glucose, capillary     Status: Abnormal   Collection Time: 01/28/18  4:37 AM  Result Value Ref Range   Glucose-Capillary 183 (H) 65 - 99 mg/dL  Glucose, capillary     Status: Abnormal   Collection Time: 01/28/18  8:06 AM  Result Value Ref Range   Glucose-Capillary 191 (H) 65 - 99 mg/dL    Dg Foot Complete Right  Result Date: 01/28/2018 CLINICAL DATA:  Wound at the second toe of the right foot, with drainage. Diabetic right foot infection. EXAM: RIGHT FOOT COMPLETE - 3+ VIEW COMPARISON:  None. FINDINGS: There appears to be fragmentation and erosion at the second middle and distal phalanges, concerning for osteomyelitis. No radiopaque foreign bodies are seen. Remaining osseous structures appear grossly intact. The subtalar joint is unremarkable in appearance. Soft tissue swelling is noted about the forefoot. IMPRESSION: Fragmentation and erosion at the second middle and distal phalanges, concerning for osteomyelitis. Electronically Signed   By: Garald Balding M.D.   On: 01/28/2018 00:50    Review of Systems  Constitutional: Positive for fever (Once, and low-grade). Negative  for chills and weight  loss.  HENT: Negative for ear discharge, ear pain, hearing loss and tinnitus.   Eyes: Negative for blurred vision, double vision, photophobia and pain.  Respiratory: Negative for cough, sputum production and shortness of breath.   Cardiovascular: Negative for chest pain.  Gastrointestinal: Negative for abdominal pain, nausea and vomiting.  Genitourinary: Negative for dysuria, flank pain, frequency and urgency.  Musculoskeletal: Negative for back pain, falls, joint pain, myalgias and neck pain.  Neurological: Negative for dizziness, tingling, sensory change, focal weakness, loss of consciousness and headaches.  Endo/Heme/Allergies: Does not bruise/bleed easily.  Psychiatric/Behavioral: Negative for depression, memory loss and substance abuse. The patient is not nervous/anxious.    Blood pressure (!) 163/79, pulse 86, temperature 97.7 F (36.5 C), temperature source Oral, resp. rate 18, height _0  (1.727 m), weight 110.1 kg (242 lb 11.6 oz), SpO2 96 %. Physical Exam  Constitutional: She appears well-developed and well-nourished. No distress.  HENT:  Head: Normocephalic and atraumatic.  Eyes: Conjunctivae are normal. Right eye exhibits no discharge. Left eye exhibits no discharge. No scleral icterus.  Neck: Normal range of motion.  Cardiovascular: Normal rate and regular rhythm.  Respiratory: Effort normal. No respiratory distress.  Musculoskeletal:  RLE No traumatic wounds, ecchymosis, or rash  Mild TTP base of 2nd toe, ulceration with purulent discharge along medial 2nd toe  No knee or ankle effusion  Knee stable to varus/ valgus and anterior/posterior stress  Sens DPN, SPN, TN intact  Motor EHL, ext, flex, evers 5/5  DP 0, PT 0, 2+ edema, no erythema  LLE No traumatic wounds, ecchymosis, or rash  Nontender  No knee or ankle effusion  Knee stable to varus/ valgus and anterior/posterior stress  Sens DPN, SPN, TN intact  Motor EHL, ext, flex, evers 5/5  DP  2+, PT 2+, No significant edema  Neurological: She is alert.  Skin: Skin is warm and dry. She is not diaphoretic.  Psychiatric: She has a normal mood and affect. Her behavior is normal.    Assessment/Plan: Diabetic foot ulcer right second toe with underlying osteomyelitis of distal and middle phalanges -- Awaiting ABI's but pulse difference suggests a vascular component. May need VVS consult. Discussed options including long course IV abx and amputation. She is very resistant to amputation and so ID consult may be warranted as well. She also doesn't want to stay in the hospital another night. From an orthopedic standpoint she can be managed as an outpatient and could see Dr. Sharol Given in the office though I encouraged her to stay. Dr. Sharol Given will see her this afternoon or early in the AM.    Lisette Abu, PA-C Orthopedic Surgery [phone] 01/28/2018, 9:14 AM

## 2018-01-28 NOTE — Progress Notes (Signed)
MD gave patient permission to shower, as long as her foot doesn't get wet.

## 2018-01-28 NOTE — Consult Note (Signed)
[facility] Nurse wound consult note Reason for Consult: Right foot, second digit with full thickness wound with necrotic slough in patient with DM.  Surrounding erythema, warmth and edema, pain in a previously insensate foot. Wound type:Neuropathic, infectious Pressure Injury POA: NA Measurement:2.6cm x 2.2cm with depth obscured by the presence of necrotic (white) stringy slough. Wound bed:As described above Drainage (amount, consistency, odor) scant serous  Periwound: erythematous, edematous and warmth Dressing procedure/placement/frequency: I will implement a conservative POC using saline dressings three times daily. Patient will require an orthotic for ambulation.  Recommend consultation with Orthopedic MD to determine digit salvageability.  If you agree, please order/arrange. If surgery is not indicated, consider use of an enzymatic debriding agent (eg., collagenase/Santyl) once daily with close follow up in an outpatient [facility] of her choosing or with her PCP.  [facility] nursing team will not follow, but will remain available to this patient, the nursing and medical teams.  Please re-consult if needed. Thanks, Maudie Flakes, MSN, RN, [place], Arther Abbott  Pager# [phone]

## 2018-01-28 NOTE — Progress Notes (Signed)
PROGRESS NOTE                                                                                                                                                                                                             Patient Demographics:    Melanie Atkins, is a 67 y.o. female, DOB - [birth year], ZDG:[id number]  Admit date - 01/27/2018   Admitting Physician Vianne Bulls, MD  Outpatient Primary MD for the patient is Rankins, Bill Salinas, MD  LOS - 1  Outpatient Specialists: None  No chief complaint on file.      Brief Narrative  67 year old obese female with history of uncontrolled diabetes mellitus (hemoglobin A1c >9), hypertension with 2 weeks history of right second toe ulcer associated with increased swelling, erythema and drainage.  No prior history of ulcer or similar symptoms. Patient seen by PCP and was sent to the hospital for direct admission for infected diabetic foot ulcer.  Patient was afebrile with normal WBC.  X-ray of the right foot showed osteomyelitis of the right second toe.      Subjective:   Patient denies any pain in her foot.  Became tearful when I discussed about possibility of amputation.   Assessment  & Plan :    Principal Problem:   Diabetic osteomyelitis ([place]) ABI pending.  (Pulses absent on right foot).  Orthopedic consult appreciated and have contacted Dr. Sharol Given to for evaluation.  Patient tearful and wanting to avoid surgery.  Also recommend vascular consult if ABI is abnormal. Holding antibiotics for possible surgery and better yield on culture.  Active Problems:   Essential hypertension Stable.  Resume home dose HCTZ/ACE inhibitor.    Uncontrolled type II diabetes mellitus ([place]) Reports being on metformin and Levemir.  Currently maintained on sliding scale coverage while she is n.p.o.  At bedtime Levemir.  A1C of 9.5.  About tight blood glucose control as outpatient.  OBESITY (  BMI >30)    Code Status : Full code  Family Communication  : Husband at bedside  Disposition Plan  : Pending further surgery evaluation and hospital course  Barriers For Discharge : Active symptoms  Consults  : Orthopedics  Procedures  : ABI  DVT Prophylaxis  :  Lovenox -   Lab Results  Component Value Date   PLT 338 01/28/2018    Antibiotics  :  Anti-infectives (From admission, onward)   None        Objective:   Vitals:   01/27/18 2243 01/28/18 0437  BP: (!) 105/55 (!) 163/79  Pulse: 88 86  Resp: 18 18  Temp: 98.4 F (36.9 C) 97.7 F (36.5 C)  TempSrc: Oral Oral  SpO2: 96% 96%  Weight: 110.1 kg (242 lb 11.6 oz)   Height: 5\' 8"  (1.727 m)     Wt Readings from Last 3 Encounters:  01/27/18 110.1 kg (242 lb 11.6 oz)  01/13/13 105.7 kg (233 lb)     Intake/Output Summary (Last 24 hours) at 01/28/2018 1150 Last data filed at 01/28/2018 6301 Gross per 24 hour  Intake 173.33 ml  Output 800 ml  Net -626.67 ml     Physical Exam  Gen: not in distress HEENT:  moist mucosa, supple neck Chest: clear b/l, no added sounds CVS: N S1&S2, no murmurs, GI: soft, NT, ND,  Musculoskeletal: warm, right foot edema with absent distal pulses.  Erythema with swelling and ulceration of the right second toe with foul smell.  Nontender.     Data Review:    CBC Recent Labs  Lab 01/28/18 0102  WBC 6.9  HGB 12.6  HCT 38.2  PLT 338  MCV 89.5  MCH 29.5  MCHC 33.0  RDW 12.7  LYMPHSABS 2.0  MONOABS 0.5  EOSABS 0.1  BASOSABS 0.0    Chemistries  Recent Labs  Lab 01/28/18 0102  NA 136  K 3.7  CL 96*  CO2 25  GLUCOSE 180*  BUN 12  CREATININE 0.64  CALCIUM 9.3  AST 25  ALT 30  ALKPHOS 102  BILITOT 0.6   ------------------------------------------------------------------------------------------------------------------ No results for input(s): CHOL, HDL, LDLCALC, TRIG, CHOLHDL, LDLDIRECT in the last 72 hours.  Lab Results  Component Value Date    HGBA1C 9.5 (H) 01/28/2018   ------------------------------------------------------------------------------------------------------------------ No results for input(s): TSH, T4TOTAL, T3FREE, THYROIDAB in the last 72 hours.  Invalid input(s): FREET3 ------------------------------------------------------------------------------------------------------------------ No results for input(s): VITAMINB12, FOLATE, FERRITIN, TIBC, IRON, RETICCTPCT in the last 72 hours.  Coagulation profile No results for input(s): INR, PROTIME in the last 168 hours.  No results for input(s): DDIMER in the last 72 hours.  Cardiac Enzymes No results for input(s): CKMB, TROPONINI, MYOGLOBIN in the last 168 hours.  Invalid input(s): CK ------------------------------------------------------------------------------------------------------------------ No results found for: BNP  Inpatient Medications  Scheduled Meds: . insulin aspart  0-9 Units Subcutaneous Q4H  . omega-3 acid ethyl esters  1 g Oral Daily   Continuous Infusions: PRN Meds:.acetaminophen **OR** acetaminophen, hydrALAZINE, HYDROcodone-acetaminophen, ondansetron **OR** ondansetron (ZOFRAN) IV, senna-docusate  Micro Results No results found for this or any previous visit (from the past 240 hour(s)).  Radiology Reports Dg Foot Complete Right  Result Date: 01/28/2018 CLINICAL DATA:  Wound at the second toe of the right foot, with drainage. Diabetic right foot infection. EXAM: RIGHT FOOT COMPLETE - 3+ VIEW COMPARISON:  None. FINDINGS: There appears to be fragmentation and erosion at the second middle and distal phalanges, concerning for osteomyelitis. No radiopaque foreign bodies are seen. Remaining osseous structures appear grossly intact. The subtalar joint is unremarkable in appearance. Soft tissue swelling is noted about the forefoot. IMPRESSION: Fragmentation and erosion at the second middle and distal phalanges, concerning for osteomyelitis.  Electronically Signed   By: Garald Balding M.D.   On: 01/28/2018 00:50    Time Spent in minutes  25   Kyliegh Jester M.D on 01/28/2018 at 11:50 AM  Between 7am to 7pm -  Pager - [phone]  After 7pm go to [email or web address] - password [facility]  [facility] -  Office  [phone]

## 2018-01-28 NOTE — Consult Note (Signed)
ORTHOPAEDIC CONSULTATION  REQUESTING PHYSICIAN: Dhungel, Nishant, MD  Chief Complaint: Ulceration swelling right foot second toe  HPI: Melanie Atkins is a 67 y.o. female who presents with over a 2-week history of ulcerations swelling draining right foot second toe.  Past Medical History:  Diagnosis Date  . Diabetes mellitus without complication ([place])   . 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 Hypertension    History reviewed. No pertinent surgical history. Social History   Socioeconomic History  . Marital status: Married    Spouse name: None  . Number of children: None  . Years of education: None  . Highest education level: None  Social Needs  . Financial resource strain: None  . Food insecurity - worry: None  . Food insecurity - inability: None  . Transportation needs - medical: None  . Transportation needs - non-medical: None  Occupational History  . None  Tobacco Use  . Smoking status: None  Substance and Sexual Activity  . Alcohol use: None  . Drug use: None  . Sexual activity: None  Other Topics Concern  . None  Social History Narrative  . None   History reviewed. No pertinent family history. - negative except otherwise stated in the family history section No Known Allergies Prior to Admission medications   Medication Sig Start Date End Date Taking? Authorizing Provider  aspirin 325 MG EC tablet Take 650 mg by mouth every 6 (six) hours as needed for pain (headache).   Yes [provider]  atorvastatin (LIPITOR) 80 MG tablet Take 80 mg by mouth daily. 11/25/17  Yes [provider]  benazepril-hydrochlorthiazide (LOTENSIN HCT) 20-25 MG per tablet Take 1 tablet by mouth daily.  11/26/12  Yes [provider]  GLUCOS-CHONDROIT-CA-MG-C-D PO Take 1 tablet by mouth every morning.    Yes [provider]  JANUVIA 100 MG tablet Take 100 mg by mouth daily. 12/25/17  Yes [provider]  LEVEMIR FLEXTOUCH 100 UNIT/ML Pen  Inject 40 Units into the skin daily at 10 pm.  01/13/18  Yes [provider]  metFORMIN (GLUCOPHAGE-XR) 500 MG 24 hr tablet Take 2,000 mg by mouth daily with supper. 01/13/18  Yes [provider]  Omega-3 Fatty Acids (FISH OIL) 1000 MG CAPS Take 1,000 mg by mouth every morning.    Yes [provider]   Dg Foot Complete Right  Result Date: 01/28/2018 CLINICAL DATA:  Wound at the second toe of the right foot, with drainage. Diabetic right foot infection. EXAM: RIGHT FOOT COMPLETE - 3+ VIEW COMPARISON:  None. FINDINGS: There appears to be fragmentation and erosion at the second middle and distal phalanges, concerning for osteomyelitis. No radiopaque foreign bodies are seen. Remaining osseous structures appear grossly intact. The subtalar joint is unremarkable in appearance. Soft tissue swelling is noted about the forefoot. IMPRESSION: Fragmentation and erosion at the second middle and distal phalanges, concerning for osteomyelitis. Electronically Signed   By: Garald Balding M.D.   On: 01/28/2018 00:50   - pertinent xrays, CT, MRI studies were reviewed and independently interpreted  Positive ROS: All other systems have been reviewed and were otherwise negative with the exception of those mentioned in the HPI and as above.  Physical Exam: General: Alert, no acute distress Psychiatric: Patient is competent for consent with normal mood and affect Lymphatic: No axillary or cervical lymphadenopathy Cardiovascular: No pedal edema Respiratory: No cyanosis, no use of accessory musculature GI: No organomegaly, abdomen is soft and non-tender  Skin: Patient has ulceration  medially on the second toe with extends down to bone and tendon.  There is no cellulitis proximal to the MTP joint.  Images:  @ENCIMAGES @   Neurologic: Patient does not have protective sensation bilateral lower extremities.   MUSCULOSKELETAL:  Patient has sausage digit swelling of the second toe consistent  with osteomyelitis there is ulceration.  Patient has a palpable dorsalis pedis and posterior tibial pulse bilaterally.  Radiographs are consistent with osteomyelitis right foot second toe  Assessment: Assessment: Diabetic insensate neuropathy with ulceration osteomyelitis and cellulitis right foot second toe.  Plan: Plan: Recommended proceeding with a second ray amputation.  Discussed that she will be on IV antibiotics for 48 hours, preop,  plan for surgery on Friday morning with the second ray amputation.  Patient will need to be strict nonweightbearing on the right foot for 2 weeks postoperatively.  Discussed with the foot dependent and with swelling and weightbearing patient is at risk of wound dehiscence nonhealing wound need for higher level amputation.  Patient states she understands wishes to proceed at this time.  Possible discharge to home on Saturday if she can ambulate with a walker or crutches nonweightbearing.  Thank you for the consult and the opportunity to see [name], MD [facility] [phone] 1:21 PM

## 2018-01-28 NOTE — Progress Notes (Signed)
Patient was a direct admit from her MD office. Patient A&O x4 on arrival. Admissions paged to let them know patient had arrived and need MD to see patient. Admissions returned my call stated Opyd,MD would be by to see the patient and write orders. Will continue to monitor and treat per MD orders.

## 2018-01-28 NOTE — Progress Notes (Signed)
VASCULAR LAB PRELIMINARY  ARTERIAL  ABI completed:    RIGHT    LEFT    PRESSURE WAVEFORM  PRESSURE WAVEFORM  BRACHIAL 163 Triphasic BRACHIAL 159 Triphasic  DP 81 Monophasic DP 111 Monophasic  AT   AT    PT  Unable to insonate PT  Unable to insonate  PER   PER    GREAT TOE  NA GREAT TOE  NA    RIGHT LEFT  ABI 0.50 0.68   The right ABI is suggestive of moderate, borderline severe, arterial insufficiency at rest. The left ABI is suggestive of moderate arterial insufficiency at rest.  Preliminary results discussed with Dr. Clementeen Graham.  01/28/2018 11:57 AM Maudry Mayhew, BS, RVT, RDCS, RDMS

## 2018-01-28 NOTE — Progress Notes (Signed)
Inpatient Diabetes Program Recommendations  AACE/ADA: New Consensus Statement on Inpatient Glycemic Control (2015)  Target Ranges:  Prepandial:   less than 140 mg/dL      Peak postprandial:   less than 180 mg/dL (1-2 hours)      Critically ill patients:  140 - 180 mg/dL   Results for RAGHAD, LORENZ (MRN [id number]) as of 01/28/2018 09:00  Ref. Range 01/27/2018 22:48 01/27/2018 23:50 01/28/2018 04:37 01/28/2018 08:06  Glucose-Capillary Latest Ref Range: 65 - 99 mg/dL 202 (H) 184 (H)  2 units Novolog  183 (H)  2 units Novolog  191 (H)  2 units Novolog    Results for RONA, TOMSON (MRN [id number]) as of 01/28/2018 09:00  Ref. Range 01/28/2018 01:02  Hemoglobin A1C Latest Ref Range: 4.8 - 5.6 % 9.5 (H)  Average glucose 225 mg/dl     Admit Foot ulcer with swelling and drainage.   History: DM2  Home DM Meds: Metformin daily (unknown dose)  Current Orders: Novolog Sensitive Correction Scale/ SSI (0-9 units) Q4 hours      Note Novolog SSI just started last night at midnight.  Current hemoglobin A1c results show poor control at home: 9.5%.  Will try to see patient today to discuss current A1c results and home DM regimen.      --Will follow patient during hospitalization--  Wyn Quaker RN, MSN, CDE Diabetes Coordinator Inpatient Glycemic Control Team Team Pager: [phone] (8a-5p)

## 2018-01-28 NOTE — Progress Notes (Addendum)
Inpatient Diabetes Program Recommendations  AACE/ADA: New Consensus Statement on Inpatient Glycemic Control (2015)  Target Ranges:  Prepandial:   less than 140 mg/dL      Peak postprandial:   less than 180 mg/dL (1-2 hours)      Critically ill patients:  140 - 180 mg/dL     Admit Foot ulcer with swelling and drainage.   History: DM2  Home DM Meds: Metformin daily (unknown dose- Pt thinks 2000 mg daily)       Levemir 40 units daily  Current Orders: Novolog Sensitive Correction Scale/ SSI (0-9 units) Q4 hours    MD- Please consider the following in-hospital insulin adjustments:  1. Start Levemir 10 units daily (25% total home dose)  2. Change Novolog SSI to TID AC + HS  3. Start Novolog Meal Coverage: Novolog 3 units TID with meals (hold if pt eats <50% of meal)     Met with pt today.  Spoke with patient about her current A1c of 9.5%.  Explained what an A1c is and what it measures.  Reminded patient that her goal A1c is 7% or less per ADA standards to prevent both acute and long-term complications.  Explained to patient the extreme importance of good glucose control at home.  Encouraged patient to check her CBGs at least bid at home (fasting and another check within the day) and to record all CBGs in a logbook for her PCP to review.  Patient told me her toe was fine 2 weeks ago and then she noticed she was having troubles with it. Does not check CBGs often at home (only checking once every 3-4 days).  States she hates checking CBGs and doesn't like to check b/c of the discomfort from the sticks and b/c she states she doesn't know what to do if the CBG is high.  Discussed normal glucose goals for home.  Explained to pt that by not checking her CBGs we don't have much data to review to help better manage her medications.  Explained that her PCP (Dr. Zadie Rhine with [facility]) needs CBG data so she can help her (the patient) better manage her glucose levels.  Explained to patient that  occasional elevated glucose levels can occur, however, if her CBGs stay elevated >250 mg/dl for more than 12 hours at home, that it is best to stay hydrated with H2O, take medications, and call PCP for further advice.  Also encouraged pt to speak with her PCP about the new Freestyle Libre 14 day glucose sensor which would greatly reduce the need to stick her fingers for glucose readings.     --Will follow patient during hospitalization--  Wyn Quaker RN, MSN, CDE Diabetes Coordinator Inpatient Glycemic Control Team Team Pager: [phone] (8a-5p)

## 2018-01-28 NOTE — Progress Notes (Signed)
CRITICAL VALUE ALERT  Critical Value:  Lactic Acid 2.3  Date & Time Notied:  01/28/18 at 0150  Provider Notified: Opyd,MD  Orders Received/Actions taken: IV bolus ordered.

## 2018-01-28 NOTE — H&P (View-Only) (Signed)
ORTHOPAEDIC CONSULTATION  REQUESTING PHYSICIAN: Dhungel, Nishant, MD  Chief Complaint: Ulceration swelling right foot second toe  HPI: Melanie Atkins is a 67 y.o. female who presents with over a 2-week history of ulcerations swelling draining right foot second toe.  Past Medical History:  Diagnosis Date  . Diabetes mellitus without complication ([place])   . 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 Hypertension    History reviewed. No pertinent surgical history. Social History   Socioeconomic History  . Marital status: Married    Spouse name: None  . Number of children: None  . Years of education: None  . Highest education level: None  Social Needs  . Financial resource strain: None  . Food insecurity - worry: None  . Food insecurity - inability: None  . Transportation needs - medical: None  . Transportation needs - non-medical: None  Occupational History  . None  Tobacco Use  . Smoking status: None  Substance and Sexual Activity  . Alcohol use: None  . Drug use: None  . Sexual activity: None  Other Topics Concern  . None  Social History Narrative  . None   History reviewed. No pertinent family history. - negative except otherwise stated in the family history section No Known Allergies Prior to Admission medications   Medication Sig Start Date End Date Taking? Authorizing Provider  aspirin 325 MG EC tablet Take 650 mg by mouth every 6 (six) hours as needed for pain (headache).   Yes [provider]  atorvastatin (LIPITOR) 80 MG tablet Take 80 mg by mouth daily. 11/25/17  Yes [provider]  benazepril-hydrochlorthiazide (LOTENSIN HCT) 20-25 MG per tablet Take 1 tablet by mouth daily.  11/26/12  Yes [provider]  GLUCOS-CHONDROIT-CA-MG-C-D PO Take 1 tablet by mouth every morning.    Yes [provider]  JANUVIA 100 MG tablet Take 100 mg by mouth daily. 12/25/17  Yes [provider]  LEVEMIR FLEXTOUCH 100 UNIT/ML Pen  Inject 40 Units into the skin daily at 10 pm.  01/13/18  Yes [provider]  metFORMIN (GLUCOPHAGE-XR) 500 MG 24 hr tablet Take 2,000 mg by mouth daily with supper. 01/13/18  Yes [provider]  Omega-3 Fatty Acids (FISH OIL) 1000 MG CAPS Take 1,000 mg by mouth every morning.    Yes [provider]   Dg Foot Complete Right  Result Date: 01/28/2018 CLINICAL DATA:  Wound at the second toe of the right foot, with drainage. Diabetic right foot infection. EXAM: RIGHT FOOT COMPLETE - 3+ VIEW COMPARISON:  None. FINDINGS: There appears to be fragmentation and erosion at the second middle and distal phalanges, concerning for osteomyelitis. No radiopaque foreign bodies are seen. Remaining osseous structures appear grossly intact. The subtalar joint is unremarkable in appearance. Soft tissue swelling is noted about the forefoot. IMPRESSION: Fragmentation and erosion at the second middle and distal phalanges, concerning for osteomyelitis. Electronically Signed   By: Garald Balding M.D.   On: 01/28/2018 00:50   - pertinent xrays, CT, MRI studies were reviewed and independently interpreted  Positive ROS: All other systems have been reviewed and were otherwise negative with the exception of those mentioned in the HPI and as above.  Physical Exam: General: Alert, no acute distress Psychiatric: Patient is competent for consent with normal mood and affect Lymphatic: No axillary or cervical lymphadenopathy Cardiovascular: No pedal edema Respiratory: No cyanosis, no use of accessory musculature GI: No organomegaly, abdomen is soft and non-tender  Skin: Patient has ulceration  medially on the second toe with extends down to bone and tendon.  There is no cellulitis proximal to the MTP joint.  Images:  @ENCIMAGES @   Neurologic: Patient does not have protective sensation bilateral lower extremities.   MUSCULOSKELETAL:  Patient has sausage digit swelling of the second toe consistent  with osteomyelitis there is ulceration.  Patient has a palpable dorsalis pedis and posterior tibial pulse bilaterally.  Radiographs are consistent with osteomyelitis right foot second toe  Assessment: Assessment: Diabetic insensate neuropathy with ulceration osteomyelitis and cellulitis right foot second toe.  Plan: Plan: Recommended proceeding with a second ray amputation.  Discussed that she will be on IV antibiotics for 48 hours, preop,  plan for surgery on Friday morning with the second ray amputation.  Patient will need to be strict nonweightbearing on the right foot for 2 weeks postoperatively.  Discussed with the foot dependent and with swelling and weightbearing patient is at risk of wound dehiscence nonhealing wound need for higher level amputation.  Patient states she understands wishes to proceed at this time.  Possible discharge to home on Saturday if she can ambulate with a walker or crutches nonweightbearing.  Thank you for the consult and the opportunity to see [name], MD [place] [phone] 1:21 PM

## 2018-01-29 ENCOUNTER — Other Ambulatory Visit (INDEPENDENT_AMBULATORY_CARE_PROVIDER_SITE_OTHER): Payer: Self-pay | Admitting: Orthopedic Surgery

## 2018-01-29 DIAGNOSIS — M86271 Subacute osteomyelitis, right ankle and foot: Secondary | ICD-10-CM

## 2018-01-29 DIAGNOSIS — L089 Local infection of the skin and subcutaneous tissue, unspecified: Secondary | ICD-10-CM

## 2018-01-29 DIAGNOSIS — E11628 Type 2 diabetes mellitus with other skin complications: Secondary | ICD-10-CM

## 2018-01-29 DIAGNOSIS — E1165 Type 2 diabetes mellitus with hyperglycemia: Secondary | ICD-10-CM

## 2018-01-29 DIAGNOSIS — I10 Essential (primary) hypertension: Secondary | ICD-10-CM

## 2018-01-29 DIAGNOSIS — M86371 Chronic multifocal osteomyelitis, right ankle and foot: Secondary | ICD-10-CM | POA: Diagnosis not present

## 2018-01-29 LAB — GLUCOSE, CAPILLARY
GLUCOSE-CAPILLARY: 233 mg/dL — AB (ref 65–99)
GLUCOSE-CAPILLARY: 217 mg/dL — AB (ref 65–99)
GLUCOSE-CAPILLARY: 252 mg/dL — AB (ref 65–99)
Glucose-Capillary: 201 mg/dL — ABNORMAL HIGH (ref 65–99)
Glucose-Capillary: 208 mg/dL — ABNORMAL HIGH (ref 65–99)
Glucose-Capillary: 196 mg/dL — ABNORMAL HIGH (ref 65–99)

## 2018-01-29 MED ORDER — MUPIROCIN 2 % EX OINT
1.0000 "application " | TOPICAL_OINTMENT | Freq: Two times a day (BID) | CUTANEOUS | Status: DC
  Administered 2018-01-30: 1 via NASAL
  Filled 2018-01-29 (×2): qty 22

## 2018-01-29 NOTE — Progress Notes (Signed)
Inpatient Diabetes Program Recommendations  AACE/ADA: New Consensus Statement on Inpatient Glycemic Control (2015)  Target Ranges:  Prepandial:   less than 140 mg/dL      Peak postprandial:   less than 180 mg/dL (1-2 hours)      Critically ill patients:  140 - 180 mg/dL   Review of Glycemic Control  History:DM2  HomeDM Meds: Metformindaily(unknown dose- Pt thinks 2000 mg daily)                             Levemir 40 units daily                                    Januvia 100 mg Daily  Current Orders:Lantus 10 units qhs, Novolog Moderate Correction Scale/ SSI (0-15 units)tid, Novolog hs scale 0-5 units  Inpatient Diabetes Program Recommendations:    Glucose trends in the 200's after Levemir 10 units. Patient takes more Levemir at home. Consider increasing Levemir to 16-18 units.  Thanks,  Tama Headings RN, MSN, BC-ADM, [facility] Inpatient Diabetes Coordinator Team Pager [phone] (8a-5p)

## 2018-01-29 NOTE — Progress Notes (Signed)
PROGRESS NOTE    Melanie Atkins  LXB:[id number] DOB: 14-May-1951 DOA: 01/27/2018 PCP: Aretta Nip, MD   Brief Narrative: 67 year old obese female with history of uncontrolled diabetes mellitus (hemoglobin A1c >9), hypertension with 2 weeks history of right second toe ulcer associated with increased swelling, erythema and drainage.  No prior history of ulcer or similar symptoms. Patient seen by PCP and was sent to the hospital for direct admission for infected diabetic foot ulcer.  Patient was afebrile with normal WBC.  X-ray of the right foot showed osteomyelitis of the right second toe.  Assessment & Plan:   #Diabetic osteomyelitis of right second toe: Evaluated by Dr. Sharol Given from orthopedics, plan for possible surgical intervention tomorrow.  Patient has abnormal ABI on the right leg therefore vascular surgery consult was obtained.  Patient has no fever, no pain no leukocytosis.  Holding antibiotics for possible surgery and better revealed on culture.  Recommend to send out for culture during surgery.  I have discussed with Dr. Linus Salmons from infectious disease today.  I will order n.p.o. order from midnight.  # Essential Hypertension: Monitor blood pressure.  On benazepril and hydrochlorothiazide.  #Uncontrolled DM type II: Continue current insulin regimen.  Monitor blood sugar level.  # Obesity: Education.  DVT prophylaxis: Early ambulation and SCD. Code Status: Full code Family Communication: No family at bedside Disposition Plan: Discharge after surgical intervention and therapy evaluation    Consultants:   Vascular surgery  Orthopedics  Discussed with Dr.Comer  Procedures: None Antimicrobials: None  Subjective: Seen and examined at bedside.  Denies headache, dizziness, nausea vomiting.  Patient reported no difficulty with walking.  Denies chest pain or shortness of breath.  Objective: Vitals:   01/28/18 1445 01/28/18 2100 01/29/18 0500 01/29/18 0854  BP: 122/63  129/73 (!) 152/80 125/70  Pulse: 87 82 82 84  Resp: 18 18 18 18   Temp: 97.7 F (36.5 C) 98 F (36.7 C) 97.9 F (36.6 C) 98.5 F (36.9 C)  TempSrc: Oral Oral Oral Oral  SpO2: 95% 98% 98% 94%  Weight:      Height:       No intake or output data in the 24 hours ending 01/29/18 1244 Filed Weights   01/27/18 2243  Weight: 110.1 kg (242 lb 11.6 oz)    Examination:  General exam: Appears calm and comfortable  Respiratory system: Clear to auscultation. Respiratory effort normal. No wheezing or crackle Cardiovascular system: S1 & S2 heard, RRR.  No pedal edema. Gastrointestinal system: Abdomen is nondistended, soft and nontender. Normal bowel sounds heard. Central nervous system: Alert and oriented. No focal neurological deficits. Extremities: Symmetric 5 x 5 power.  Right foot has dressing applied and support. Psychiatry: Judgement and insight appear normal. Mood & affect appropriate.     Data Reviewed: I have personally reviewed following labs and imaging studies  CBC: Recent Labs  Lab 01/28/18 0102  WBC 6.9  NEUTROABS 4.3  HGB 12.6  HCT 38.2  MCV 89.5  PLT 416   Basic Metabolic Panel: Recent Labs  Lab 01/28/18 0102  NA 136  K 3.7  CL 96*  CO2 25  GLUCOSE 180*  BUN 12  CREATININE 0.64  CALCIUM 9.3   GFR: Estimated Creatinine Clearance: 92.4 mL/min (by C-G formula based on SCr of 0.64 mg/dL). Liver Function Tests: Recent Labs  Lab 01/28/18 0102  AST 25  ALT 30  ALKPHOS 102  BILITOT 0.6  PROT 7.0  ALBUMIN 3.0*   No results for input(s): LIPASE, AMYLASE  in the last 168 hours. No results for input(s): AMMONIA in the last 168 hours. Coagulation Profile: No results for input(s): INR, PROTIME in the last 168 hours. Cardiac Enzymes: No results for input(s): CKTOTAL, CKMB, CKMBINDEX, TROPONINI in the last 168 hours. BNP (last 3 results) No results for input(s): PROBNP in the last 8760 hours. HbA1C: Recent Labs    01/28/18 0102  HGBA1C 9.5*    CBG: Recent Labs  Lab 01/28/18 2022 01/29/18 0009 01/29/18 0511 01/29/18 0746 01/29/18 1141  GLUCAP 256* 201* 208* 233* 217*   Lipid Profile: No results for input(s): CHOL, HDL, LDLCALC, TRIG, CHOLHDL, LDLDIRECT in the last 72 hours. Thyroid Function Tests: No results for input(s): TSH, T4TOTAL, FREET4, T3FREE, THYROIDAB in the last 72 hours. Anemia Panel: No results for input(s): VITAMINB12, FOLATE, FERRITIN, TIBC, IRON, RETICCTPCT in the last 72 hours. Sepsis Labs: Recent Labs  Lab 01/28/18 0102 01/28/18 0416  LATICACIDVEN 2.3* 1.1    No results found for this or any previous visit (from the past 240 hour(s)).       Radiology Studies: Dg Foot Complete Right  Result Date: 01/28/2018 CLINICAL DATA:  Wound at the second toe of the right foot, with drainage. Diabetic right foot infection. EXAM: RIGHT FOOT COMPLETE - 3+ VIEW COMPARISON:  None. FINDINGS: There appears to be fragmentation and erosion at the second middle and distal phalanges, concerning for osteomyelitis. No radiopaque foreign bodies are seen. Remaining osseous structures appear grossly intact. The subtalar joint is unremarkable in appearance. Soft tissue swelling is noted about the forefoot. IMPRESSION: Fragmentation and erosion at the second middle and distal phalanges, concerning for osteomyelitis. Electronically Signed   By: Garald Balding M.D.   On: 01/28/2018 00:50        Scheduled Meds: . benazepril  20 mg Oral Daily   And  . hydrochlorothiazide  25 mg Oral Daily  . insulin aspart  0-15 Units Subcutaneous TID WC  . insulin aspart  0-5 Units Subcutaneous QHS  . insulin detemir  10 Units Subcutaneous QHS  . omega-3 acid ethyl esters  1 g Oral Daily  . protein supplement shake  11 oz Oral BID BM   Continuous Infusions:   LOS: 2 days    Dron Tanna Furry, MD [facility] Pager [phone]  If 7PM-7AM, please contact night-coverage [email or web address] Password [email or web address] 01/29/2018, 12:44  PM

## 2018-01-29 NOTE — Anesthesia Preprocedure Evaluation (Addendum)
Anesthesia Evaluation  Patient identified by MRN, date of birth, ID band Patient awake    Reviewed: Allergy & Precautions, NPO status , Patient's Chart, lab work & pertinent test results  Airway Mallampati: II  TM Distance: >3 FB Neck ROM: Full    Dental no notable dental hx.    Pulmonary former smoker,    Pulmonary exam normal breath sounds clear to auscultation       Cardiovascular hypertension, Pt. on medications + Peripheral Vascular Disease  Normal cardiovascular exam Rhythm:Regular Rate:Normal     Neuro/Psych negative neurological ROS  negative psych ROS   GI/Hepatic negative GI ROS, Neg liver ROS,   Endo/Other  diabetes, Type 2, Oral Hypoglycemic Agents, Insulin Dependent  Renal/GU negative Renal ROS     Musculoskeletal negative musculoskeletal ROS (+)   Abdominal   Peds  Hematology negative hematology ROS (+)   Anesthesia Other Findings   Reproductive/Obstetrics negative OB ROS                           Anesthesia Physical Anesthesia Plan  ASA: III  Anesthesia Plan: General   Post-op Pain Management:    Induction: Intravenous  PONV Risk Score and Plan: 3 and Ondansetron, Treatment may vary due to age or medical condition and Propofol infusion  Airway Management Planned: LMA  Additional Equipment:   Intra-op Plan:   Post-operative Plan: Extubation in OR  Informed Consent: I have reviewed the patients History and Physical, chart, labs and discussed the procedure including the risks, benefits and alternatives for the proposed anesthesia with the patient or authorized representative who has indicated his/her understanding and acceptance.     Plan Discussed with: CRNA  Anesthesia Plan Comments: ( )       Anesthesia Quick Evaluation

## 2018-01-29 NOTE — H&P (View-Only) (Signed)
CONSULT NOTE   MRN : [id number]  Reason for Consult: Right second toe osteomyelitis with abnormal ABI's  Referring Physician: Dr. Carolin Sicks  History of Present Illness: Melanie Atkins is a 67 y.o. (1951/09/01) female diabetic with  2 week history of right foot swelling and second toe ulcer.  Patient is uncertain of the mechanism of injury.  She notes moderate pain with manipulation of the right foot.  Pain is sharp in character.   She denies any anes/paraesthesia.  She denies fever,chills, but does not serous drainage from the right toe.  She has no history of symptoms of claudication, rest pain or previous non healing wounds.     Past Medical History:  Diagnosis Date  . 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 High cholesterol   . History of blood transfusion ~ 2002   "related to hairy cell leukemia"  . Hypertension   . Type II diabetes mellitus ([place])     Past Surgical History:  Procedure Laterality Date  . ABDOMINAL HYSTERECTOMY    . CESAREAN SECTION  1994  . TONSILLECTOMY      Social History   Socioeconomic History  . Marital status: Married    Spouse name: Not on file  . Number of children: Not on file  . Years of education: Not on file  . Highest education level: Not on file  Social Needs  . Financial resource strain: Not on file  . Food insecurity - worry: Not on file  . Food insecurity - inability: Not on file  . Transportation needs - medical: Not on file  . Transportation needs - non-medical: Not on file  Occupational History  . Not on file  Tobacco Use  . Smoking status: Former Smoker    Packs/day: 0.50    Years: 4.00    Pack years: 2.00    Types: Cigarettes    Last attempt to quit: 1978    Years since quitting: 41.2  . Smokeless tobacco: Never Used  Substance and Sexual Activity  . Alcohol use: Yes    Alcohol/week: 2.4 oz    Types: 4 Glasses of wine per week  . Drug use: No  . Sexual activity: Not Currently  Other Topics Concern  . Not on file   Social History Narrative  . Not on file    Family History: patient is unable to detail the medical history of his parents   Current Facility-Administered Medications  Medication Dose Route Frequency Provider Last Rate Last Dose  . acetaminophen (TYLENOL) tablet 650 mg  650 mg Oral Q6H PRN Opyd, Ilene Qua, MD       Or  . acetaminophen (TYLENOL) suppository 650 mg  650 mg Rectal Q6H PRN Opyd, Ilene Qua, MD      . benazepril (LOTENSIN) tablet 20 mg  20 mg Oral Daily Dhungel, Nishant, MD   20 mg at 01/29/18 1003   And  . hydrochlorothiazide (HYDRODIURIL) tablet 25 mg  25 mg Oral Daily Dhungel, Nishant, MD   25 mg at 01/29/18 1003  . hydrALAZINE (APRESOLINE) injection 10 mg  10 mg Intravenous Q4H PRN Opyd, Ilene Qua, MD      . HYDROcodone-acetaminophen (NORCO/VICODIN) 5-325 MG per tablet 1-2 tablet  1-2 tablet Oral Q4H PRN Opyd, Ilene Qua, MD      . insulin aspart (novoLOG) injection 0-15 Units  0-15 Units Subcutaneous TID WC Dhungel, Nishant, MD   5 Units at 01/29/18 1204  . insulin aspart (novoLOG) injection 0-5 Units  0-5 Units Subcutaneous QHS Dhungel, Nishant, MD   3 Units at 01/28/18 2113  . insulin detemir (LEVEMIR) injection 10 Units  10 Units Subcutaneous QHS Dhungel, Nishant, MD   10 Units at 01/28/18 2115  . omega-3 acid ethyl esters (LOVAZA) capsule 1 g  1 g Oral Daily Opyd, Ilene Qua, MD   1 g at 01/29/18 1002  . ondansetron (ZOFRAN) tablet 4 mg  4 mg Oral Q6H PRN Opyd, Ilene Qua, MD       Or  . ondansetron (ZOFRAN) injection 4 mg  4 mg Intravenous Q6H PRN Opyd, Ilene Qua, MD      . protein supplement (PREMIER PROTEIN) liquid  11 oz Oral BID BM Dhungel, Nishant, MD   11 oz at 01/29/18 1209  . senna-docusate (Senokot-S) tablet 1 tablet  1 tablet Oral QHS PRN Opyd, Ilene Qua, MD         No Known Allergies  REVIEW OF SYSTEMS (negative unless checked):   Cardiac:  []  Chest pain or chest pressure? []  Shortness of breath upon activity? []  Shortness of breath when lying  flat? []  Irregular heart rhythm?  Vascular:  []  Pain in calf, thigh, or hip brought on by walking? []  Pain in feet at night that wakes you up from your sleep? []  Blood clot in your veins? [x]  Leg swelling?  Pulmonary:  []  Oxygen at home? []  Productive cough? []  Wheezing?  Neurologic:  []  Sudden weakness in arms or legs? []  Sudden numbness in arms or legs? []  Sudden onset of difficult speaking or slurred speech? []  Temporary loss of vision in one eye? []  Problems with dizziness?  Gastrointestinal:  []  Blood in stool? []  Vomited blood?  Genitourinary:  []  Burning when urinating? []  Blood in urine?  Psychiatric:  []  Major depression  Hematologic:  []  Bleeding problems? []  Problems with blood clotting?  Dermatologic:  [x]  Rashes or ulcers?  Constitutional:  []  Fever or chills?  Ear/Nose/Throat:  []  Change in hearing? []  Nose bleeds? []  Sore throat?  Musculoskeletal:  []  Back pain? []  Joint pain? []  Muscle pain?   Physical Examination Vitals:   01/28/18 1445 01/28/18 2100 01/29/18 0500 01/29/18 0854  BP: 122/63 129/73 (!) 152/80 125/70  Pulse: 87 82 82 84  Resp: 18 18 18 18   Temp: 97.7 F (36.5 C) 98 F (36.7 C) 97.9 F (36.6 C) 98.5 F (36.9 C)  TempSrc: Oral Oral Oral Oral  SpO2: 95% 98% 98% 94%  Weight:      Height:       Body mass index is 36.91 kg/m.  General:  WDWN in NAD Gait: Normal, walking in her room HENT: WNL Eyes: Pupils equal Pulmonary: normal non-labored breathing , without Rales, rhonchi,  wheezing Cardiac: RRR, without  Murmurs, rubs or gallops; No carotid bruits Abdomen: soft, NT, no masses Skin: no rashes, right medial ulcers second toe noted yellow eschar;  no Gangrene , no cellulitis;  Vascular Exam/Pulses:radial, brachial, femoral palpable pulses.  Doppler biphasic peroneal, monophasic DP/PT      Musculoskeletal: no muscle wasting or atrophy; mild edema with cellulitis right foot  Neurologic: A&O X 3; Appropriate  Affect; SENSATION: normal; MOTOR FUNCTION: 5/5 Symmetric; Speech is fluent/normal Psychiatric: Judgment intact, Mood & affect appropriate for pt's clinical situation Lymph : No Cervical, Axillary, or Inguinal lymphadenopathy   Laboratory   CBC CBC Latest Ref Rng & Units 01/28/2018 12/07/2014 12/03/2013  WBC 4.0 - 10.5 K/uL 6.9 5.8 5.6  Hemoglobin 12.0 - 15.0 g/dL 12.6 14.3 14.9  Hematocrit 36.0 - 46.0 % 38.2 41.7 43.5  Platelets 150 - 400 K/uL 338 242 233    BMP BMP Latest Ref Rng & Units 01/28/2018 12/10/2010  Glucose 65 - 99 mg/dL 180(H) 224(H)  BUN 6 - 20 mg/dL 12 15  Creatinine 0.44 - 1.00 mg/dL 0.64 0.52  Sodium 135 - 145 mmol/L 136 136  Potassium 3.5 - 5.1 mmol/L 3.7 3.9  Chloride 101 - 111 mmol/L 96(L) 96  CO2 22 - 32 mmol/L 25 28  Calcium 8.9 - 10.3 mg/dL 9.3 9.2    Coagulation No results found for: INR, PROTIME No results found for: PTT  Lipids No results found for: CHOL, TRIG, HDL, CHOLHDL, VLDL, LDLCALC, LDLDIRECT   Radiology     Dg Foot Complete Right  Result Date: 01/28/2018 CLINICAL DATA:  Wound at the second toe of the right foot, with drainage. Diabetic right foot infection. EXAM: RIGHT FOOT COMPLETE - 3+ VIEW COMPARISON:  None. FINDINGS: There appears to be fragmentation and erosion at the second middle and distal phalanges, concerning for osteomyelitis. No radiopaque foreign bodies are seen. Remaining osseous structures appear grossly intact. The subtalar joint is unremarkable in appearance. Soft tissue swelling is noted about the forefoot. IMPRESSION: Fragmentation and erosion at the second middle and distal phalanges, concerning for osteomyelitis. Electronically Signed   By: Garald Balding M.D.   On: 01/28/2018 00:50    Non-invasive Vascular Imaging   ABI (01/28/18)  R:   ABI: 0.50,   PT: none  DP: mono  L:   ABI: 0.68,   PT: none  DP: mono   Medical Decision Making     1.  Right second osteomyelitis 2.  R severe PAD, L mod PAD 3.   Uncontrolled DM (Hg A1c 9.5)   Pt scheduled for L 2nd ray amputation tomorrow  There is enough time to complete an Aortogram, right leg runoff, and possible intervention prior to that amputation  Ideally revascularization if possible could be done prior to the amputation to give the best chance of healing.   Roxy Horseman 01/29/2018 1:18 PM    Addendum  I have independently interviewed and examined the patient, and I agree with the physician assistant's findings.  Pt is uncontrolled diabetic as evident by Hg A1c 9.5.  Right 2nd toe has osteomyelitis and will need to be amputated.  Will schedule pt for angiography tomorrow (0730) and possible intervention to give her a better chance of healing the R 2nd ray amputation tomorrow, scheduled with Dr. Sharol Given (1030).  Obviously further optimization of diabetes management will be essential to helping her heal her amputation.   Melanie Barthel, MD, FACS [facility] Office: [phone] Pager: [phone]  01/29/2018, 3:31 PM

## 2018-01-29 NOTE — Consult Note (Addendum)
CONSULT NOTE   MRN : [id number]  Reason for Consult: Right second toe osteomyelitis with abnormal ABI's  Referring Physician: Dr. Carolin Sicks  History of Present Illness: Melanie Atkins is a 67 y.o. (15-Sep-1951) female diabetic with  2 week history of right foot swelling and second toe ulcer.  Patient is uncertain of the mechanism of injury.  She notes moderate pain with manipulation of the right foot.  Pain is sharp in character.   She denies any anes/paraesthesia.  She denies fever,chills, but does not serous drainage from the right toe.  She has no history of symptoms of claudication, rest pain or previous non healing wounds.     Past Medical History:  Diagnosis Date  . 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 High cholesterol   . History of blood transfusion ~ 2002   "related to hairy cell leukemia"  . Hypertension   . Type II diabetes mellitus ([place])     Past Surgical History:  Procedure Laterality Date  . ABDOMINAL HYSTERECTOMY    . CESAREAN SECTION  1994  . TONSILLECTOMY      Social History   Socioeconomic History  . Marital status: Married    Spouse name: Not on file  . Number of children: Not on file  . Years of education: Not on file  . Highest education level: Not on file  Social Needs  . Financial resource strain: Not on file  . Food insecurity - worry: Not on file  . Food insecurity - inability: Not on file  . Transportation needs - medical: Not on file  . Transportation needs - non-medical: Not on file  Occupational History  . Not on file  Tobacco Use  . Smoking status: Former Smoker    Packs/day: 0.50    Years: 4.00    Pack years: 2.00    Types: Cigarettes    Last attempt to quit: 1978    Years since quitting: 41.2  . Smokeless tobacco: Never Used  Substance and Sexual Activity  . Alcohol use: Yes    Alcohol/week: 2.4 oz    Types: 4 Glasses of wine per week  . Drug use: No  . Sexual activity: Not Currently  Other Topics Concern  . Not on file   Social History Narrative  . Not on file    Family History: patient is unable to detail the medical history of his parents   Current Facility-Administered Medications  Medication Dose Route Frequency Provider Last Rate Last Dose  . acetaminophen (TYLENOL) tablet 650 mg  650 mg Oral Q6H PRN Opyd, Ilene Qua, MD       Or  . acetaminophen (TYLENOL) suppository 650 mg  650 mg Rectal Q6H PRN Opyd, Ilene Qua, MD      . benazepril (LOTENSIN) tablet 20 mg  20 mg Oral Daily Dhungel, Nishant, MD   20 mg at 01/29/18 1003   And  . hydrochlorothiazide (HYDRODIURIL) tablet 25 mg  25 mg Oral Daily Dhungel, Nishant, MD   25 mg at 01/29/18 1003  . hydrALAZINE (APRESOLINE) injection 10 mg  10 mg Intravenous Q4H PRN Opyd, Ilene Qua, MD      . HYDROcodone-acetaminophen (NORCO/VICODIN) 5-325 MG per tablet 1-2 tablet  1-2 tablet Oral Q4H PRN Opyd, Ilene Qua, MD      . insulin aspart (novoLOG) injection 0-15 Units  0-15 Units Subcutaneous TID WC Dhungel, Nishant, MD   5 Units at 01/29/18 1204  . insulin aspart (novoLOG) injection 0-5 Units  0-5 Units Subcutaneous QHS Dhungel, Nishant, MD   3 Units at 01/28/18 2113  . insulin detemir (LEVEMIR) injection 10 Units  10 Units Subcutaneous QHS Dhungel, Nishant, MD   10 Units at 01/28/18 2115  . omega-3 acid ethyl esters (LOVAZA) capsule 1 g  1 g Oral Daily Opyd, Ilene Qua, MD   1 g at 01/29/18 1002  . ondansetron (ZOFRAN) tablet 4 mg  4 mg Oral Q6H PRN Opyd, Ilene Qua, MD       Or  . ondansetron (ZOFRAN) injection 4 mg  4 mg Intravenous Q6H PRN Opyd, Ilene Qua, MD      . protein supplement (PREMIER PROTEIN) liquid  11 oz Oral BID BM Dhungel, Nishant, MD   11 oz at 01/29/18 1209  . senna-docusate (Senokot-S) tablet 1 tablet  1 tablet Oral QHS PRN Opyd, Ilene Qua, MD         No Known Allergies  REVIEW OF SYSTEMS (negative unless checked):   Cardiac:  []  Chest pain or chest pressure? []  Shortness of breath upon activity? []  Shortness of breath when lying  flat? []  Irregular heart rhythm?  Vascular:  []  Pain in calf, thigh, or hip brought on by walking? []  Pain in feet at night that wakes you up from your sleep? []  Blood clot in your veins? [x]  Leg swelling?  Pulmonary:  []  Oxygen at home? []  Productive cough? []  Wheezing?  Neurologic:  []  Sudden weakness in arms or legs? []  Sudden numbness in arms or legs? []  Sudden onset of difficult speaking or slurred speech? []  Temporary loss of vision in one eye? []  Problems with dizziness?  Gastrointestinal:  []  Blood in stool? []  Vomited blood?  Genitourinary:  []  Burning when urinating? []  Blood in urine?  Psychiatric:  []  Major depression  Hematologic:  []  Bleeding problems? []  Problems with blood clotting?  Dermatologic:  [x]  Rashes or ulcers?  Constitutional:  []  Fever or chills?  Ear/Nose/Throat:  []  Change in hearing? []  Nose bleeds? []  Sore throat?  Musculoskeletal:  []  Back pain? []  Joint pain? []  Muscle pain?   Physical Examination Vitals:   01/28/18 1445 01/28/18 2100 01/29/18 0500 01/29/18 0854  BP: 122/63 129/73 (!) 152/80 125/70  Pulse: 87 82 82 84  Resp: 18 18 18 18   Temp: 97.7 F (36.5 C) 98 F (36.7 C) 97.9 F (36.6 C) 98.5 F (36.9 C)  TempSrc: Oral Oral Oral Oral  SpO2: 95% 98% 98% 94%  Weight:      Height:       Body mass index is 36.91 kg/m.  General:  WDWN in NAD Gait: Normal, walking in her room HENT: WNL Eyes: Pupils equal Pulmonary: normal non-labored breathing , without Rales, rhonchi,  wheezing Cardiac: RRR, without  Murmurs, rubs or gallops; No carotid bruits Abdomen: soft, NT, no masses Skin: no rashes, right medial ulcers second toe noted yellow eschar;  no Gangrene , no cellulitis;  Vascular Exam/Pulses:radial, brachial, femoral palpable pulses.  Doppler biphasic peroneal, monophasic DP/PT      Musculoskeletal: no muscle wasting or atrophy; mild edema with cellulitis right foot  Neurologic: A&O X 3; Appropriate  Affect; SENSATION: normal; MOTOR FUNCTION: 5/5 Symmetric; Speech is fluent/normal Psychiatric: Judgment intact, Mood & affect appropriate for pt's clinical situation Lymph : No Cervical, Axillary, or Inguinal lymphadenopathy   Laboratory   CBC CBC Latest Ref Rng & Units 01/28/2018 12/07/2014 12/03/2013  WBC 4.0 - 10.5 K/uL 6.9 5.8 5.6  Hemoglobin 12.0 - 15.0 g/dL 12.6 14.3 14.9  Hematocrit 36.0 - 46.0 % 38.2 41.7 43.5  Platelets 150 - 400 K/uL 338 242 233    BMP BMP Latest Ref Rng & Units 01/28/2018 12/10/2010  Glucose 65 - 99 mg/dL 180(H) 224(H)  BUN 6 - 20 mg/dL 12 15  Creatinine 0.44 - 1.00 mg/dL 0.64 0.52  Sodium 135 - 145 mmol/L 136 136  Potassium 3.5 - 5.1 mmol/L 3.7 3.9  Chloride 101 - 111 mmol/L 96(L) 96  CO2 22 - 32 mmol/L 25 28  Calcium 8.9 - 10.3 mg/dL 9.3 9.2    Coagulation No results found for: INR, PROTIME No results found for: PTT  Lipids No results found for: CHOL, TRIG, HDL, CHOLHDL, VLDL, LDLCALC, LDLDIRECT   Radiology     Dg Foot Complete Right  Result Date: 01/28/2018 CLINICAL DATA:  Wound at the second toe of the right foot, with drainage. Diabetic right foot infection. EXAM: RIGHT FOOT COMPLETE - 3+ VIEW COMPARISON:  None. FINDINGS: There appears to be fragmentation and erosion at the second middle and distal phalanges, concerning for osteomyelitis. No radiopaque foreign bodies are seen. Remaining osseous structures appear grossly intact. The subtalar joint is unremarkable in appearance. Soft tissue swelling is noted about the forefoot. IMPRESSION: Fragmentation and erosion at the second middle and distal phalanges, concerning for osteomyelitis. Electronically Signed   By: Garald Balding M.D.   On: 01/28/2018 00:50    Non-invasive Vascular Imaging   ABI (01/28/18)  R:   ABI: 0.50,   PT: none  DP: mono  L:   ABI: 0.68,   PT: none  DP: mono   Medical Decision Making     1.  Right second osteomyelitis 2.  R severe PAD, L mod PAD 3.   Uncontrolled DM (Hg A1c 9.5)   Pt scheduled for L 2nd ray amputation tomorrow  There is enough time to complete an Aortogram, right leg runoff, and possible intervention prior to that amputation  Ideally revascularization if possible could be done prior to the amputation to give the best chance of healing.   Roxy Horseman 01/29/2018 1:18 PM    Addendum  I have independently interviewed and examined the patient, and I agree with the physician assistant's findings.  Pt is uncontrolled diabetic as evident by Hg A1c 9.5.  Right 2nd toe has osteomyelitis and will need to be amputated.  Will schedule pt for angiography tomorrow (0730) and possible intervention to give her a better chance of healing the R 2nd ray amputation tomorrow, scheduled with Dr. Sharol Given (1030).  Obviously further optimization of diabetes management will be essential to helping her heal her amputation.   Adele Barthel, MD, FACS [facility] Office: [phone] Pager: [phone]  01/29/2018, 3:31 PM

## 2018-01-29 NOTE — Care Management Note (Signed)
Case Management Note  Patient Details  Name: Melanie Atkins MRN: [id number] Date of Birth: 1951/08/18  Subjective/Objective:  Presents with diabetic foot ulcer, hx of hypertension and  type 2 diabetes mellitus. From home with husband. Independent with ADL's, no DME usage PTA.      Starling Manns (Spouse) Estell Harpin (Sister)     [phone]      PCP: Milagros Evener  Action/Plan: Plan: second ray amputation 3/15 (tomorrow) ... CM following for disposition needs.   Expected Discharge Date:                  Expected Discharge Plan:  Home/Self Care  In-House Referral:     Discharge planning Services  CM Consult  Post Acute Care Choice:    Choice offered to:     DME Arranged:    DME Agency:     HH Arranged:    HH Agency:     Status of Service:  In process, will continue to follow  If discussed at Long Length of Stay Meetings, dates discussed:    Additional Comments:  Sharin Mons, RN 01/29/2018, 11:23 AM

## 2018-01-30 ENCOUNTER — Encounter (HOSPITAL_COMMUNITY)
Admission: AD | Disposition: A | Discharge status: Home / Self Care | Payer: Self-pay | Source: Ambulatory Visit | Attending: Nephrology

## 2018-01-30 ENCOUNTER — Encounter (HOSPITAL_COMMUNITY): Payer: Self-pay | Admitting: *Deleted

## 2018-01-30 ENCOUNTER — Inpatient Hospital Stay (HOSPITAL_COMMUNITY): Payer: 59 | Admitting: Anesthesiology

## 2018-01-30 DIAGNOSIS — M86271 Subacute osteomyelitis, right ankle and foot: Secondary | ICD-10-CM

## 2018-01-30 DIAGNOSIS — M86371 Chronic multifocal osteomyelitis, right ankle and foot: Secondary | ICD-10-CM | POA: Diagnosis not present

## 2018-01-30 HISTORY — PX: AMPUTATION: SHX166

## 2018-01-30 HISTORY — PX: PERIPHERAL VASCULAR INTERVENTION: CATH118257

## 2018-01-30 HISTORY — PX: ABDOMINAL AORTOGRAM W/LOWER EXTREMITY: CATH118223

## 2018-01-30 LAB — POCT ACTIVATED CLOTTING TIME
ACTIVATED CLOTTING TIME: 224 s
ACTIVATED CLOTTING TIME: 175 s
Activated Clotting Time: 241 seconds

## 2018-01-30 LAB — GLUCOSE, CAPILLARY
GLUCOSE-CAPILLARY: 233 mg/dL — AB (ref 65–99)
GLUCOSE-CAPILLARY: 224 mg/dL — AB (ref 65–99)
GLUCOSE-CAPILLARY: 224 mg/dL — AB (ref 65–99)
Glucose-Capillary: 198 mg/dL — ABNORMAL HIGH (ref 65–99)
Glucose-Capillary: 275 mg/dL — ABNORMAL HIGH (ref 65–99)

## 2018-01-30 LAB — SURGICAL PCR SCREEN
MRSA, PCR: NEGATIVE
STAPHYLOCOCCUS AUREUS: NEGATIVE

## 2018-01-30 SURGERY — ABDOMINAL AORTOGRAM W/LOWER EXTREMITY
Anesthesia: LOCAL | Laterality: Right

## 2018-01-30 SURGERY — AMPUTATION, FOOT, RAY
Anesthesia: General | Site: Foot | Laterality: Right

## 2018-01-30 MED ORDER — METOCLOPRAMIDE HCL 5 MG/ML IJ SOLN: 5.0000 mg | Freq: Three times a day (TID) | INTRAMUSCULAR | Status: DC | PRN

## 2018-01-30 MED ORDER — HYDROCODONE-ACETAMINOPHEN 5-325 MG PO TABS
1.0000 | ORAL_TABLET | ORAL | Status: DC | PRN
  Administered 2018-01-30 – 2018-01-31 (×2): 2 via ORAL
  Filled 2018-01-30 (×2): qty 2

## 2018-01-30 MED ORDER — HEPARIN SODIUM (PORCINE) 1000 UNIT/ML IJ SOLN
INTRAMUSCULAR | Status: AC
  Filled 2018-01-30: qty 1

## 2018-01-30 MED ORDER — MIDAZOLAM HCL 2 MG/2ML IJ SOLN
INTRAMUSCULAR | Status: DC | PRN
  Administered 2018-01-30: 2 mg via INTRAVENOUS

## 2018-01-30 MED ORDER — FENTANYL CITRATE (PF) 100 MCG/2ML IJ SOLN: 25.0000 ug | INTRAMUSCULAR | Status: DC | PRN

## 2018-01-30 MED ORDER — ONDANSETRON HCL 4 MG/2ML IJ SOLN
INTRAMUSCULAR | Status: DC | PRN
  Administered 2018-01-30: 4 mg via INTRAVENOUS

## 2018-01-30 MED ORDER — ONDANSETRON HCL 4 MG PO TABS: 4.0000 mg | ORAL_TABLET | Freq: Four times a day (QID) | ORAL | Status: DC | PRN

## 2018-01-30 MED ORDER — METHOCARBAMOL 500 MG PO TABS
500.0000 mg | ORAL_TABLET | Freq: Four times a day (QID) | ORAL | Status: DC | PRN
  Administered 2018-01-31: 500 mg via ORAL
  Filled 2018-01-30: qty 1

## 2018-01-30 MED ORDER — INSULIN GLARGINE 100 UNIT/ML ~~LOC~~ SOLN
15.0000 [IU] | Freq: Every day | SUBCUTANEOUS | Status: DC
  Administered 2018-01-30: 15 [IU] via SUBCUTANEOUS
  Filled 2018-01-30: qty 0.15

## 2018-01-30 MED ORDER — ARTIFICIAL TEARS OPHTHALMIC OINT
TOPICAL_OINTMENT | OPHTHALMIC | Status: AC
  Filled 2018-01-30: qty 3.5

## 2018-01-30 MED ORDER — PROMETHAZINE HCL 25 MG/ML IJ SOLN: 6.2500 mg | INTRAMUSCULAR | Status: DC | PRN

## 2018-01-30 MED ORDER — POLYETHYLENE GLYCOL 3350 17 G PO PACK: 17.0000 g | PACK | Freq: Every day | ORAL | Status: DC | PRN

## 2018-01-30 MED ORDER — LACTATED RINGERS IV SOLN
INTRAVENOUS | Status: DC
  Administered 2018-01-30: 13:00:00 via INTRAVENOUS

## 2018-01-30 MED ORDER — CHLORHEXIDINE GLUCONATE 4 % EX LIQD: 60.0000 mL | Freq: Once | CUTANEOUS | Status: DC

## 2018-01-30 MED ORDER — BISACODYL 10 MG RE SUPP: 10.0000 mg | Freq: Every day | RECTAL | Status: DC | PRN

## 2018-01-30 MED ORDER — LABETALOL HCL 5 MG/ML IV SOLN
10.0000 mg | INTRAVENOUS | Status: DC | PRN
Start: 2018-01-30 — End: 2018-01-31
  Administered 2018-01-30: 10 mg via INTRAVENOUS

## 2018-01-30 MED ORDER — MIDAZOLAM HCL 2 MG/2ML IJ SOLN
INTRAMUSCULAR | Status: AC
  Filled 2018-01-30: qty 2

## 2018-01-30 MED ORDER — HYDROMORPHONE HCL 1 MG/ML IJ SOLN
INTRAMUSCULAR | Status: AC
  Filled 2018-01-30: qty 1

## 2018-01-30 MED ORDER — CEFAZOLIN SODIUM-DEXTROSE 2-4 GM/100ML-% IV SOLN
2.0000 g | INTRAVENOUS | Status: AC
  Administered 2018-01-30: 2 g via INTRAVENOUS
  Filled 2018-01-30: qty 100

## 2018-01-30 MED ORDER — MORPHINE SULFATE (PF) 2 MG/ML IV SOLN
2.0000 mg | INTRAVENOUS | Status: DC | PRN
  Administered 2018-01-31: 2 mg via INTRAVENOUS
  Filled 2018-01-30: qty 1

## 2018-01-30 MED ORDER — CEFAZOLIN SODIUM-DEXTROSE 2-4 GM/100ML-% IV SOLN
2.0000 g | Freq: Four times a day (QID) | INTRAVENOUS | Status: AC
  Administered 2018-01-30 – 2018-01-31 (×3): 2 g via INTRAVENOUS
  Filled 2018-01-30 (×4): qty 100

## 2018-01-30 MED ORDER — METHOCARBAMOL 1000 MG/10ML IJ SOLN
500.0000 mg | Freq: Four times a day (QID) | INTRAVENOUS | Status: DC | PRN
  Filled 2018-01-30: qty 5

## 2018-01-30 MED ORDER — FENTANYL CITRATE (PF) 100 MCG/2ML IJ SOLN
INTRAMUSCULAR | Status: DC | PRN
  Administered 2018-01-30: 25 ug via INTRAVENOUS

## 2018-01-30 MED ORDER — CLOPIDOGREL BISULFATE 75 MG PO TABS
75.0000 mg | ORAL_TABLET | Freq: Every day | ORAL | Status: DC
  Administered 2018-01-31: 75 mg via ORAL
  Filled 2018-01-30: qty 1

## 2018-01-30 MED ORDER — ACETAMINOPHEN 325 MG PO TABS: 325.0000 mg | ORAL_TABLET | ORAL | Status: DC | PRN

## 2018-01-30 MED ORDER — HYDRALAZINE HCL 20 MG/ML IJ SOLN
INTRAMUSCULAR | Status: DC | PRN
  Administered 2018-01-30: 10 mg via INTRAVENOUS

## 2018-01-30 MED ORDER — LIDOCAINE HCL (PF) 1 % IJ SOLN
INTRAMUSCULAR | Status: DC | PRN
  Administered 2018-01-30: 20 mL via INTRADERMAL

## 2018-01-30 MED ORDER — SODIUM CHLORIDE 0.9 % IV SOLN: INTRAVENOUS | Status: AC

## 2018-01-30 MED ORDER — OXYCODONE HCL 5 MG PO TABS: 5.0000 mg | ORAL_TABLET | Freq: Once | ORAL | Status: DC | PRN

## 2018-01-30 MED ORDER — FENTANYL CITRATE (PF) 250 MCG/5ML IJ SOLN
INTRAMUSCULAR | Status: AC
  Filled 2018-01-30: qty 5

## 2018-01-30 MED ORDER — FENTANYL CITRATE (PF) 100 MCG/2ML IJ SOLN
INTRAMUSCULAR | Status: DC | PRN
  Administered 2018-01-30 (×2): 50 ug via INTRAVENOUS

## 2018-01-30 MED ORDER — SODIUM CHLORIDE 0.9% FLUSH
3.0000 mL | INTRAVENOUS | Status: DC | PRN
  Administered 2018-01-30: 3 mL via INTRAVENOUS
  Filled 2018-01-30: qty 3

## 2018-01-30 MED ORDER — INSULIN GLARGINE 100 UNIT/ML ~~LOC~~ SOLN: 10.0000 [IU] | Freq: Every day | SUBCUTANEOUS | Status: DC

## 2018-01-30 MED ORDER — HEPARIN (PORCINE) IN NACL 2-0.9 UNIT/ML-% IJ SOLN
INTRAMUSCULAR | Status: AC
  Filled 2018-01-30: qty 500

## 2018-01-30 MED ORDER — FENTANYL CITRATE (PF) 100 MCG/2ML IJ SOLN
INTRAMUSCULAR | Status: AC
  Filled 2018-01-30: qty 2

## 2018-01-30 MED ORDER — LABETALOL HCL 5 MG/ML IV SOLN
INTRAVENOUS | Status: AC
  Filled 2018-01-30: qty 4

## 2018-01-30 MED ORDER — SODIUM CHLORIDE 0.9 % IV SOLN: INTRAVENOUS | Status: DC

## 2018-01-30 MED ORDER — SODIUM CHLORIDE 0.9% FLUSH
3.0000 mL | Freq: Two times a day (BID) | INTRAVENOUS | Status: DC
  Administered 2018-01-30 – 2018-01-31 (×2): 3 mL via INTRAVENOUS

## 2018-01-30 MED ORDER — CLOPIDOGREL BISULFATE 75 MG PO TABS
300.0000 mg | ORAL_TABLET | Freq: Once | ORAL | Status: AC
  Administered 2018-01-30: 300 mg via ORAL

## 2018-01-30 MED ORDER — HEPARIN (PORCINE) IN NACL 2-0.9 UNIT/ML-% IJ SOLN
INTRAMUSCULAR | Status: AC | PRN
  Administered 2018-01-30 (×2): 500 mL

## 2018-01-30 MED ORDER — KETOROLAC TROMETHAMINE 30 MG/ML IJ SOLN: 30.0000 mg | Freq: Once | INTRAMUSCULAR | Status: DC | PRN

## 2018-01-30 MED ORDER — MIDAZOLAM HCL 5 MG/5ML IJ SOLN
INTRAMUSCULAR | Status: DC | PRN
  Administered 2018-01-30: 2 mg via INTRAVENOUS

## 2018-01-30 MED ORDER — HEPARIN SODIUM (PORCINE) 1000 UNIT/ML IJ SOLN
INTRAMUSCULAR | Status: DC | PRN
  Administered 2018-01-30: 10000 [IU] via INTRAVENOUS
  Administered 2018-01-30: 3000 [IU] via INTRAVENOUS

## 2018-01-30 MED ORDER — ACETAMINOPHEN 325 MG PO TABS: 325.0000 mg | ORAL_TABLET | Freq: Four times a day (QID) | ORAL | Status: DC | PRN

## 2018-01-30 MED ORDER — ONDANSETRON HCL 4 MG/2ML IJ SOLN: 4.0000 mg | Freq: Four times a day (QID) | INTRAMUSCULAR | Status: DC | PRN

## 2018-01-30 MED ORDER — MEPERIDINE HCL 50 MG/ML IJ SOLN: 6.2500 mg | INTRAMUSCULAR | Status: DC | PRN

## 2018-01-30 MED ORDER — HYDROMORPHONE HCL 1 MG/ML IJ SOLN
0.2500 mg | INTRAMUSCULAR | Status: DC | PRN
  Administered 2018-01-30 (×2): 0.5 mg via INTRAVENOUS

## 2018-01-30 MED ORDER — IODIXANOL 320 MG/ML IV SOLN
INTRAVENOUS | Status: DC | PRN
  Administered 2018-01-30: 180 mL via INTRAVENOUS

## 2018-01-30 MED ORDER — ASPIRIN EC 81 MG PO TBEC
81.0000 mg | DELAYED_RELEASE_TABLET | Freq: Every day | ORAL | Status: DC
  Administered 2018-01-31: 81 mg via ORAL
  Filled 2018-01-30: qty 1

## 2018-01-30 MED ORDER — 0.9 % SODIUM CHLORIDE (POUR BTL) OPTIME
TOPICAL | Status: DC | PRN
  Administered 2018-01-30: 1000 mL

## 2018-01-30 MED ORDER — HYDROCODONE-ACETAMINOPHEN 7.5-325 MG PO TABS: 1.0000 | ORAL_TABLET | ORAL | Status: DC | PRN

## 2018-01-30 MED ORDER — CLOPIDOGREL BISULFATE 300 MG PO TABS
ORAL_TABLET | ORAL | Status: AC
  Filled 2018-01-30: qty 1

## 2018-01-30 MED ORDER — SODIUM CHLORIDE 0.9 % IV SOLN: 250.0000 mL | INTRAVENOUS | Status: DC | PRN

## 2018-01-30 MED ORDER — ONDANSETRON HCL 4 MG/2ML IJ SOLN
INTRAMUSCULAR | Status: AC
  Filled 2018-01-30: qty 2

## 2018-01-30 MED ORDER — MAGNESIUM CITRATE PO SOLN: 1.0000 | Freq: Once | ORAL | Status: DC | PRN

## 2018-01-30 MED ORDER — PROPOFOL 10 MG/ML IV BOLUS
INTRAVENOUS | Status: DC | PRN
  Administered 2018-01-30: 200 mg via INTRAVENOUS

## 2018-01-30 MED ORDER — ACETAMINOPHEN 160 MG/5ML PO SOLN: 325.0000 mg | ORAL | Status: DC | PRN

## 2018-01-30 MED ORDER — MORPHINE SULFATE (PF) 2 MG/ML IV SOLN: 0.5000 mg | INTRAVENOUS | Status: DC | PRN

## 2018-01-30 MED ORDER — METOCLOPRAMIDE HCL 5 MG PO TABS: 5.0000 mg | ORAL_TABLET | Freq: Three times a day (TID) | ORAL | Status: DC | PRN

## 2018-01-30 MED ORDER — OXYCODONE HCL 5 MG/5ML PO SOLN: 5.0000 mg | Freq: Once | ORAL | Status: DC | PRN

## 2018-01-30 MED ORDER — DOCUSATE SODIUM 100 MG PO CAPS
100.0000 mg | ORAL_CAPSULE | Freq: Two times a day (BID) | ORAL | Status: DC
  Administered 2018-01-30 – 2018-01-31 (×2): 100 mg via ORAL
  Filled 2018-01-30 (×2): qty 1

## 2018-01-30 MED ORDER — SODIUM CHLORIDE 0.9 % IV SOLN
INTRAVENOUS | Status: DC
  Administered 2018-01-30: 18:00:00 via INTRAVENOUS

## 2018-01-30 MED ORDER — ONDANSETRON HCL 4 MG/2ML IJ SOLN: 4.0000 mg | Freq: Once | INTRAMUSCULAR | Status: DC | PRN

## 2018-01-30 MED ORDER — LIDOCAINE 2% (20 MG/ML) 5 ML SYRINGE
INTRAMUSCULAR | Status: DC | PRN
  Administered 2018-01-30: 100 mg via INTRAVENOUS

## 2018-01-30 SURGICAL SUPPLY — 29 items
BLADE SAW SGTL MED 73X18.5 STR (BLADE) IMPLANT
BLADE SURG 21 STRL SS (BLADE) ×2 IMPLANT
BNDG COHESIVE 4X5 TAN STRL (GAUZE/BANDAGES/DRESSINGS) ×2 IMPLANT
BNDG GAUZE ELAST 4 BULKY (GAUZE/BANDAGES/DRESSINGS) ×2 IMPLANT
COVER SURGICAL LIGHT HANDLE (MISCELLANEOUS) ×4 IMPLANT
DRAPE U-SHAPE 47X51 STRL (DRAPES) ×4 IMPLANT
DRSG ADAPTIC 3X8 NADH LF (GAUZE/BANDAGES/DRESSINGS) ×2 IMPLANT
DRSG EMULSION OIL 3X3 NADH (GAUZE/BANDAGES/DRESSINGS) ×1 IMPLANT
DRSG PAD ABDOMINAL 8X10 ST (GAUZE/BANDAGES/DRESSINGS) ×4 IMPLANT
DURAPREP 26ML APPLICATOR (WOUND CARE) ×2 IMPLANT
ELECT REM PT RETURN 9FT ADLT (ELECTROSURGICAL) ×2
ELECTRODE REM PT RTRN 9FT ADLT (ELECTROSURGICAL) ×1 IMPLANT
GAUZE SPONGE 4X4 12PLY STRL (GAUZE/BANDAGES/DRESSINGS) ×2 IMPLANT
GAUZE SPONGE 4X4 12PLY STRL LF (GAUZE/BANDAGES/DRESSINGS) ×1 IMPLANT
GLOVE BIOGEL PI IND STRL 9 (GLOVE) ×1 IMPLANT
GLOVE BIOGEL PI INDICATOR 9 (GLOVE) ×1
GLOVE SURG ORTHO 9.0 STRL STRW (GLOVE) ×2 IMPLANT
GOWN STRL REUS W/ TWL XL LVL3 (GOWN DISPOSABLE) ×2 IMPLANT
GOWN STRL REUS W/TWL XL LVL3 (GOWN DISPOSABLE) ×4
KIT BASIN OR (CUSTOM PROCEDURE TRAY) ×2 IMPLANT
KIT ROOM TURNOVER OR (KITS) ×2 IMPLANT
NS IRRIG 1000ML POUR BTL (IV SOLUTION) ×2 IMPLANT
PACK ORTHO EXTREMITY (CUSTOM PROCEDURE TRAY) ×2 IMPLANT
PAD ARMBOARD 7.5X6 YLW CONV (MISCELLANEOUS) ×4 IMPLANT
STOCKINETTE IMPERVIOUS LG (DRAPES) IMPLANT
SUT ETHILON 2 0 PSLX (SUTURE) ×2 IMPLANT
TOWEL OR 17X26 10 PK STRL BLUE (TOWEL DISPOSABLE) ×2 IMPLANT
TUBE CONNECTING 12X1/4 (SUCTIONS) ×2 IMPLANT
YANKAUER SUCT BULB TIP NO VENT (SUCTIONS) ×2 IMPLANT

## 2018-01-30 SURGICAL SUPPLY — 28 items
BAG SNAP BAND KOVER 36X36 (MISCELLANEOUS) ×1 IMPLANT
BALLN ADMIRAL INPACT 6X250 (BALLOONS) ×2
BALLN MUSTANG 5X200X135 (BALLOONS) ×2
BALLN MUSTANG 6X60X75 (BALLOONS) ×2
BALLOON ADMIRAL INPACT 6X250 (BALLOONS) IMPLANT
BALLOON MUSTANG 5X200X135 (BALLOONS) IMPLANT
BALLOON MUSTANG 6X60X75 (BALLOONS) IMPLANT
CATH ANGIO 5F PIGTAIL 65CM (CATHETERS) ×1 IMPLANT
CATH CROSS OVER TEMPO 5F (CATHETERS) ×1 IMPLANT
CATH QUICKCROSS .035X135CM (MICROCATHETER) IMPLANT
CATH QUICKCROSS SUPP .035X90CM (MICROCATHETER) ×1 IMPLANT
COVER DOME SNAP 22 D (MISCELLANEOUS) ×1 IMPLANT
DEVICE CONTINUOUS FLUSH (MISCELLANEOUS) ×1 IMPLANT
GUIDEWIRE ANGLED .035X150CM (WIRE) ×1 IMPLANT
KIT ENCORE 26 ADVANTAGE (KITS) ×1 IMPLANT
KIT PV (KITS) ×2 IMPLANT
SHEATH AVANTI 11CM 5FR (SHEATH) ×1 IMPLANT
SHEATH PINNACLE ST 7F 45CM (SHEATH) ×1 IMPLANT
STENT INNOVA 7X150X130 (Permanent Stent) ×1 IMPLANT
STENT INNOVA 7X40X130 (Permanent Stent) ×1 IMPLANT
SYR MEDRAD MARK V 150ML (SYRINGE) ×2 IMPLANT
TAPE VIPERTRACK RADIOPAQ (MISCELLANEOUS) IMPLANT
TAPE VIPERTRACK RADIOPAQUE (MISCELLANEOUS) ×2
TRANSDUCER W/STOPCOCK (MISCELLANEOUS) ×2 IMPLANT
TRAY PV CATH (CUSTOM PROCEDURE TRAY) ×2 IMPLANT
WIRE BENTSON .035X145CM (WIRE) ×1 IMPLANT
WIRE HI TORQ VERSACORE J 260CM (WIRE) ×1 IMPLANT
WIRE ROSEN-J .035X180CM (WIRE) ×1 IMPLANT

## 2018-01-30 NOTE — Progress Notes (Signed)
PROGRESS NOTE    Melanie Atkins  JAS:[id number] DOB: 1951-02-01 DOA: 01/27/2018 PCP: Aretta Nip, MD   Brief Narrative: 67 year old obese female with history of uncontrolled diabetes mellitus (hemoglobin A1c >9), hypertension with 2 weeks history of right second toe ulcer associated with increased swelling, erythema and drainage.  No prior history of ulcer or similar symptoms. Patient seen by PCP and was sent to the hospital for direct admission for infected diabetic foot ulcer.  Patient was afebrile with normal WBC.  X-ray of the right foot showed osteomyelitis of the right second toe.  Assessment & Plan:   #Diabetic osteomyelitis of right second toe:  -Status post right foot second ray amputation.  Patient also underwent vascular procedure today.  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  Had angioplasty with improvement in blood flow.  Patient has drug-eluting balloon with subsequent dissection is stented.  Recommended aspirin and Plavix.  Vascular and orthopedic consult appreciated. -Repeat lab in the morning.  Diet started  # Essential Hypertension: Monitor blood pressure.  On benazepril and hydrochlorothiazide at home  #Uncontrolled DM type II: Continue current insulin regimen.  Monitor blood sugar level. Added lantus for tonight, need to adjust the dose depending on blood sugar level. Pt was NPO today. Resume home dose of insulin on discharge.  # Obesity: Education.  DVT prophylaxis: Early ambulation and SCD. Code Status: Full code Family Communication: No family at bedside Disposition Plan: likely dc home in 1-2 days   Consultants:   Vascular surgery  Orthopedics  Discussed with Dr.Comer  Procedures: None Antimicrobials: None  Subjective: Seen and examined at PACU. Pt is waking up from anesthesia.  Reported hungry.  Denied nausea vomiting chest pain or shortness  of breath.  Denies pain.  Objective: Vitals:   01/30/18 1235 01/30/18 1240 01/30/18 1500 01/30/18 1515  BP: (!) 160/69 (!) 164/78 123/69 132/76  Pulse: 77 76  84  Resp: 10 17 20 17   Temp:   97.6 F (36.4 C)   TempSrc:      SpO2: 97% 99%  97%  Weight:      Height:        Intake/Output Summary (Last 24 hours) at 01/30/2018 1552 Last data filed at 01/30/2018 1454 Gross per 24 hour  Intake 300 ml  Output 30 ml  Net 270 ml   Filed Weights   01/27/18 2243  Weight: 110.1 kg (242 lb 11.6 oz)    Examination:  General exam: Lying in bed, not in distress Respiratory system: Clear bilateral, respiratory effort normal. Cardiovascular system: Rate rhythm S1-S2 normal. Gastrointestinal system: Abdomen is nondistended, soft and nontender. Normal bowel sounds heard. Central nervous system: Alert awake and following commands  extremities: Symmetric 5 x 5 power.  Right foot with dressing applied Psychiatry: Judgement and insight appear normal. Mood & affect appropriate.     Data Reviewed: I have personally reviewed following labs and imaging studies  CBC: Recent Labs  Lab 01/28/18 0102  WBC 6.9  NEUTROABS 4.3  HGB 12.6  HCT 38.2  MCV 89.5  PLT 419   Basic Metabolic Panel: Recent Labs  Lab 01/28/18 0102  NA 136  K 3.7  CL 96*  CO2 25  GLUCOSE 180*  BUN 12  CREATININE 0.64  CALCIUM 9.3   GFR: Estimated Creatinine Clearance: 92.4 mL/min (by C-G formula based on SCr of 0.64 mg/dL). Liver Function Tests: Recent Labs  Lab 01/28/18 0102  AST  25  ALT 30  ALKPHOS 102  BILITOT 0.6  PROT 7.0  ALBUMIN 3.0*   No results for input(s): LIPASE, AMYLASE in the last 168 hours. No results for input(s): AMMONIA in the last 168 hours. Coagulation Profile: No results for input(s): INR, PROTIME in the last 168 hours. Cardiac Enzymes: No results for input(s): CKTOTAL, CKMB, CKMBINDEX, TROPONINI in the last 168 hours. BNP (last 3 results) No results for input(s): PROBNP in the  last 8760 hours. HbA1C: Recent Labs    01/28/18 0102  HGBA1C 9.5*   CBG: Recent Labs  Lab 01/29/18 1628 01/29/18 2213 01/30/18 1013 01/30/18 1319 01/30/18 1508  GLUCAP 252* 196* 233* 224* 224*   Lipid Profile: No results for input(s): CHOL, HDL, LDLCALC, TRIG, CHOLHDL, LDLDIRECT in the last 72 hours. Thyroid Function Tests: No results for input(s): TSH, T4TOTAL, FREET4, T3FREE, THYROIDAB in the last 72 hours. Anemia Panel: No results for input(s): VITAMINB12, FOLATE, FERRITIN, TIBC, IRON, RETICCTPCT in the last 72 hours. Sepsis Labs: Recent Labs  Lab 01/28/18 0102 01/28/18 0416  LATICACIDVEN 2.3* 1.1    Recent Results (from the past 240 hour(s))  Blood Cultures x 2 sites     Status: None (Preliminary result)   Collection Time: 01/28/18  1:00 AM  Result Value Ref Range Status   Specimen Description BLOOD RIGHT ARM  Final   Special Requests   Final    BOTTLES DRAWN AEROBIC AND ANAEROBIC Blood Culture results may not be optimal due to an excessive volume of blood received in culture bottles   Culture   Final    NO GROWTH 2 DAYS Performed at [facility], [place] [address], [place], [place] [address]    Report Status PENDING  Incomplete  Blood Cultures x 2 sites     Status: None (Preliminary result)   Collection Time: 01/28/18  1:06 AM  Result Value Ref Range Status   Specimen Description BLOOD LEFT ARM  Final   Special Requests   Final    BOTTLES DRAWN AEROBIC AND ANAEROBIC Blood Culture adequate volume   Culture   Final    NO GROWTH 2 DAYS Performed at [facility], [address] [place], [place] [address]    Report Status PENDING  Incomplete  Surgical PCR screen     Status: None   Collection Time: 01/29/18 11:16 PM  Result Value Ref Range Status   MRSA, PCR NEGATIVE NEGATIVE Final   Staphylococcus aureus NEGATIVE NEGATIVE Final    Comment: (NOTE) The Xpert SA Assay (FDA approved for NASAL specimens in patients 21 years of age and older), is one  component of a comprehensive surveillance program. It is not intended to diagnose infection nor to guide or monitor treatment. Performed at [facility], [place] [address] [place], [place] [address]          Radiology Studies: No results found.      Scheduled Meds: . [MAR Hold] benazepril  20 mg Oral Daily  . chlorhexidine  60 mL Topical Once  . [START ON 01/31/2018] clopidogrel  75 mg Oral Q breakfast  . HYDROmorphone      . [MAR Hold] insulin aspart  0-15 Units Subcutaneous TID WC  . [MAR Hold] insulin aspart  0-5 Units Subcutaneous QHS  . insulin glargine  10 Units Subcutaneous QHS  . [MAR Hold] mupirocin ointment  1 application Nasal BID  . [MAR Hold] omega-3 acid ethyl esters  1 g Oral Daily  . [MAR Hold] protein  supplement shake  11 oz Oral BID BM   Continuous Infusions: . sodium chloride    . lactated ringers 10 mL/hr at 01/30/18 1324     LOS: 3 days    Asal Teas Tanna Furry, MD [facility] Pager [phone]  If 7PM-7AM, please contact night-coverage [email or web address] Password [facility] 01/30/2018, 3:52 PM

## 2018-01-30 NOTE — Plan of Care (Signed)
Progressing

## 2018-01-30 NOTE — Op Note (Signed)
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  Angioplastied to 0 residual stenosis with drug-eluting balloon with subsequent dissection stented  Operative details: After obtaining informed consent, the patient was taken the [place] lab.  The patient was placed in supine position Angio table.  Both groins were prepped and draped in usual sterile fashion.  Local anesthesia was infiltrated over the left common femoral artery.  Ultrasound was used to identify the left common femoral artery and an introducer needle used to cannulate the left common femoral artery and an 035 Bentson wire advanced into the abdominal aorta under fluoroscopic guidance.  Next a 5 French sheath was placed over the guidewire in the left common femoral artery.  This was thoroughly flushed with heparinized saline.  A 5 French pigtail catheter was then advanced over the guidewire and abdominal aortogram was obtained in AP projection.  The left and right renal arteries are patent.  The left renal artery has about a 25% origin stenosis.  The infrarenal abdominal aorta is patent.  The left and right common external and internal iliac arteries are all smooth and patent.  Next the pigtail catheter was pulled down just above the aortic bifurcation and bilateral lower extremity runoff views were obtained.  In the left lower extremity, the left common femoral and profunda femoris is patent.  The left superficial femoral artery has multiple segments of 70-90% stenosis.  There is three-vessel runoff to the left foot.  In the right lower extremity, there are  similar findings.  The peroneal artery is the only runoff vessel that continues all the way to the foot.  The posterior tibial and anterior tibial arteries occlude in the mid leg.  At this point it was decided to intervene on the right superficial femoral artery stenosis.  A 5 French crossover catheter was exchanged over guidewire for the pigtail catheter.  This was used to selectively catheterize the right common iliac artery.  An 035 Rosen wire was then advanced up and over the aortic bifurcation and into the right superficial femoral artery.  A 5 French sheath was then exchanged for a 7 [place] destination sheath.  An 035 quick cross was then advanced over the [facility] wire into the superficial femoral artery.  An 035 versacore wire was then advanced across the lesion after administering a total of 13,000 units of heparin to achieve an ACT greater than 250.  Next a 5 x 120 angioplasty balloon was advanced into the right superficial femoral artery and inflated to nominal pressure for 1 minute to prep the vessel.  A 6 x 125 Impact drug-eluting balloon was then advanced and centered on the lesion and inflated to 3 minutes at nominal pressure.  Completion angiogram showed a patent right superficial femoral artery without residual stenosis but with multiple areas of dissection.  At this point to overlapping Inova stents were placed a 7 x 100 and a 7 x 40.  These were each postdilated with a 6 balloon to nominal pressure for 1 minute.  Completion Joetta Manners showed wide patency of the right superficial femoral artery stent with no residual dissection.  There was intact runoff to the right foot with actually improved  flow down the posterior tibial artery all the way to the level of foot and the peroneal artery was also still intact.  The anterior tibial artery still occluded in the mid leg.  At this point the sheath was pulled back in the left hemipelvis.  The guidewire was removed.  The sheath was left in place to be  pulled after the ACT is less than 175.  Operative management: Patient will be started on a combination of Plavix and aspirin for at least 6 weeks and consideration for indefinitely.  She is scheduled later today for Dr. Sharol Given to amputate a toe on her right foot.  This should have adequate perfusion for wound healing.  Ruta Hinds, MD [facility] Office: [phone] Pager: [phone]

## 2018-01-30 NOTE — Interval H&P Note (Signed)
History and Physical Interval Note:  01/30/2018 6:36 AM  Melanie Atkins  has presented today for surgery, with the diagnosis of Osteomyelitis, Abscess Right 2nd Toe  The various methods of treatment have been discussed with the patient and family. After consideration of risks, benefits and other options for treatment, the patient has consented to  Procedure(s): RIGHT FOOT 2ND RAY AMPUTATION (Right) as a surgical intervention .  The patient's history has been reviewed, patient examined, no change in status, stable for surgery.  I have reviewed the patient's chart and labs.  Questions were answered to the patient's satisfaction.     [name]

## 2018-01-30 NOTE — Interval H&P Note (Signed)
History and Physical Interval Note:  01/30/2018 7:42 AM  Melanie Atkins  has presented today for surgery, with the diagnosis of pvd with ulcer  The various methods of treatment have been discussed with the patient and family. After consideration of risks, benefits and other options for treatment, the patient has consented to  Procedure(s): ABDOMINAL AORTOGRAM W/LOWER EXTREMITY (Right) as a surgical intervention .  The patient's history has been reviewed, patient examined, no change in status, stable for surgery.  I have reviewed the patient's chart and labs.  Questions were answered to the patient's satisfaction.     Ruta Hinds

## 2018-01-30 NOTE — Transfer of Care (Signed)
Immediate Anesthesia Transfer of Care Note  Patient: Melanie Atkins  Procedure(s) Performed: RIGHT FOOT 2ND RAY AMPUTATION (Right Foot)  Patient Location: PACU  Anesthesia Type:General  Level of Consciousness: awake, alert , oriented and patient cooperative  Airway & Oxygen Therapy: Patient Spontanous Breathing and Patient connected to nasal cannula oxygen  Post-op Assessment: Report given to RN and Post -op Vital signs reviewed and stable  Post vital signs: Reviewed and stable  Last Vitals:  Vitals:   01/30/18 1240 01/30/18 1500  BP: (!) 164/78   Pulse: 76   Resp: 17   Temp:  (P) 36.4 C  SpO2: 99%     Last Pain:  Vitals:   01/30/18 0514  TempSrc: Oral         Complications: No apparent anesthesia complications

## 2018-01-30 NOTE — Progress Notes (Signed)
53fr sheath aspirated and removed from lfa. Manual pressure applied for 20 minutes. Groin level 0. Tegaderm dressing applied, bedrest instructions given. Bilateral pt and peroneal pulses present with doppler.  Bedrest begins at 12:15:00

## 2018-01-30 NOTE — Op Note (Signed)
01/30/2018  2:48 PM  PATIENT:  Melanie Atkins    PRE-OPERATIVE DIAGNOSIS:  Osteomyelitis, Abscess Right 2nd Toe  POST-OPERATIVE DIAGNOSIS:  Same  PROCEDURE:  RIGHT FOOT 2ND RAY AMPUTATION  SURGEON:  [name], MD  PHYSICIAN ASSISTANT:None ANESTHESIA:   General  PREOPERATIVE INDICATIONS:  Melanie Atkins is a  68 y.o. female with a diagnosis of Osteomyelitis, Abscess Right 2nd Toe who failed conservative measures and elected for surgical management.    The risks benefits and alternatives were discussed with the patient preoperatively including but not limited to the risks of infection, bleeding, nerve injury, cardiopulmonary complications, the need for revision surgery, among others, and the patient was willing to proceed.  OPERATIVE IMPLANTS: None.  @ENCIMAGES @  OPERATIVE FINDINGS: Good petechial bleeding.  No abscess.  OPERATIVE PROCEDURE: Patient was brought the operating room and underwent a general anesthetic.  After adequate levels of anesthesia were obtained patient's right lower extremity was prepped using DuraPrep draped into a sterile field a timeout was called.  A V incision was made around the second toe.  The ulcerative toe and second metatarsal were resected in one block of tissue.  Electrocautery was used for hemostasis the wound was irrigated with normal saline.  Patient has a strong dorsalis pedis pulse.  The skin was closed using 2-0 nylon.  A sterile dressing was applied patient was extubated taken the PACU in stable condition.   DISCHARGE PLANNING:  Antibiotic duration: 24 hours postoperatively  Weightbearing: Touchdown weightbearing on the right.  Pain medication: As needed  Dressing care/ Wound VAC: Leave dressing clean dry and intact until follow-up in the office.  Ambulatory devices: Walker  Discharge to: Home once patient is safe with ambulation.  Follow-up: In the office 1 week post operative.

## 2018-01-30 NOTE — Anesthesia Procedure Notes (Signed)
Procedure Name: LMA Insertion Date/Time: 01/30/2018 2:26 PM Performed by: Lowella Dell, CRNA Pre-anesthesia Checklist: Patient identified, Emergency Drugs available, Suction available and Patient being monitored Patient Re-evaluated:Patient Re-evaluated prior to induction Oxygen Delivery Method: Circle System Utilized Preoxygenation: Pre-oxygenation with 100% oxygen Induction Type: IV induction Ventilation: Mask ventilation without difficulty LMA: LMA inserted LMA Size: 4.0 Number of attempts: 1 Placement Confirmation: positive ETCO2 Tube secured with: Tape Dental Injury: Teeth and Oropharynx as per pre-operative assessment

## 2018-01-31 DIAGNOSIS — M86271 Subacute osteomyelitis, right ankle and foot: Secondary | ICD-10-CM

## 2018-01-31 LAB — BASIC METABOLIC PANEL
Anion gap: 10 (ref 5–15)
BUN: 9 mg/dL (ref 6–20)
CALCIUM: 8.3 mg/dL — AB (ref 8.9–10.3)
CO2: 26 mmol/L (ref 22–32)
CREATININE: 0.61 mg/dL (ref 0.44–1.00)
Chloride: 98 mmol/L — ABNORMAL LOW (ref 101–111)
Glucose, Bld: 225 mg/dL — ABNORMAL HIGH (ref 65–99)
Potassium: 3.8 mmol/L (ref 3.5–5.1)
SODIUM: 134 mmol/L — AB (ref 135–145)

## 2018-01-31 LAB — CBC
HCT: 35.6 % — ABNORMAL LOW (ref 36.0–46.0)
Hemoglobin: 11.6 g/dL — ABNORMAL LOW (ref 12.0–15.0)
MCH: 29.2 pg (ref 26.0–34.0)
MCHC: 32.6 g/dL (ref 30.0–36.0)
MCV: 89.7 fL (ref 78.0–100.0)
PLATELETS: 333 10*3/uL (ref 150–400)
RBC: 3.97 MIL/uL (ref 3.87–5.11)
RDW: 12.9 % (ref 11.5–15.5)
WBC: 8.7 10*3/uL (ref 4.0–10.5)

## 2018-01-31 LAB — GLUCOSE, CAPILLARY: GLUCOSE-CAPILLARY: 210 mg/dL — AB (ref 65–99)

## 2018-01-31 MED ORDER — ACETAMINOPHEN 325 MG PO TABS: 650.0000 mg | ORAL_TABLET | Freq: Four times a day (QID) | ORAL | Status: DC | PRN

## 2018-01-31 MED ORDER — ASPIRIN 81 MG PO TBEC: 81.0000 mg | DELAYED_RELEASE_TABLET | Freq: Every day | ORAL | 0 refills | Status: DC

## 2018-01-31 MED ORDER — HYDROCODONE-ACETAMINOPHEN 7.5-325 MG PO TABS: 1.0000 | ORAL_TABLET | Freq: Four times a day (QID) | ORAL | 0 refills | Status: DC | PRN

## 2018-01-31 MED ORDER — METHOCARBAMOL 500 MG PO TABS: 500.0000 mg | ORAL_TABLET | Freq: Three times a day (TID) | ORAL | 0 refills | Status: DC | PRN

## 2018-01-31 MED ORDER — CLOPIDOGREL BISULFATE 75 MG PO TABS: 75.0000 mg | ORAL_TABLET | Freq: Every day | ORAL | 0 refills | Status: DC

## 2018-01-31 MED ORDER — POLYETHYLENE GLYCOL 3350 17 G PO PACK: 17.0000 g | PACK | Freq: Every day | ORAL | 0 refills | Status: DC | PRN

## 2018-01-31 NOTE — Progress Notes (Signed)
Pt adamant about discharging this am. Refusing to wear telemetry monitor at this time. This RN explained to pt that PT and OT have to complete consultation before discharge order will be placed, per MD. Pt also requesting for IV to be removed. This RN explained that IV cannot be removed until pt has discharge order in place. Pt verbalized understanding. Will continue current plan of care.    Grant Fontana BSN, RN

## 2018-01-31 NOTE — Progress Notes (Signed)
  Progress Note    01/31/2018 9:08 AM 1 Day Post-Op  Subjective:  Denies pain L groin cath site   Vitals:   01/31/18 0158 01/31/18 0620  BP: (!) 149/83 (!) 155/86  Pulse:  87  Resp: 14 (!) 8  Temp: 97.6 F (36.4 C) 97.9 F (36.6 C)  SpO2: 96% 97%   Physical Exam: Lungs:  Non labored Extremities:  Dressing left in place RLE; cap refill <3seconds R GT; no palpable hematoma or pseudoaneurysm L groin cath site Abdomen:  soft Neurologic: A&O  CBC    Component Value Date/Time   WBC 8.7 01/31/2018 0302   RBC 3.97 01/31/2018 0302   HGB 11.6 (L) 01/31/2018 0302   HGB 14.3 12/07/2014 1018   HGB 13.8 12/23/2007 1010   HCT 35.6 (L) 01/31/2018 0302   HCT 41.7 12/07/2014 1018   HCT 40.9 12/23/2007 1010   PLT 333 01/31/2018 0302   PLT 242 12/07/2014 1018   PLT 230 12/23/2007 1010   MCV 89.7 01/31/2018 0302   MCV 88 12/07/2014 1018   MCV 87.8 12/23/2007 1010   MCH 29.2 01/31/2018 0302   MCHC 32.6 01/31/2018 0302   RDW 12.9 01/31/2018 0302   RDW 12.2 12/07/2014 1018   RDW 12.4 12/23/2007 1010   LYMPHSABS 2.0 01/28/2018 0102   LYMPHSABS 1.5 12/07/2014 1018   LYMPHSABS 1.2 12/23/2007 1010   MONOABS 0.5 01/28/2018 0102   MONOABS 0.3 12/23/2007 1010   EOSABS 0.1 01/28/2018 0102   EOSABS 0.1 12/07/2014 1018   BASOSABS 0.0 01/28/2018 0102   BASOSABS 0.0 12/07/2014 1018   BASOSABS 0.0 12/23/2007 1010    BMET    Component Value Date/Time   NA 134 (L) 01/31/2018 0302   K 3.8 01/31/2018 0302   CL 98 (L) 01/31/2018 0302   CO2 26 01/31/2018 0302   GLUCOSE 225 (H) 01/31/2018 0302   BUN 9 01/31/2018 0302   CREATININE 0.61 01/31/2018 0302   CALCIUM 8.3 (L) 01/31/2018 0302   GFRNONAA >60 01/31/2018 0302   GFRAA >60 01/31/2018 0302    INR No results found for: INR   Intake/Output Summary (Last 24 hours) at 01/31/2018 0908 Last data filed at 01/31/2018 0600 Gross per 24 hour  Intake 1092.67 ml  Output 30 ml  Net 1062.67 ml     Assessment/Plan:  67 y.o. female is s/p  R SFA stenting with subsequent R foot 2nd ray amputation 1 Day Post-Op   Unremarkable L groin catheterization site [facility] for discharge from vascular surgery standpoint Discharge with 81mg  aspirin and 75mg  plavix daily Office will notify patient for follow up in 4-6 weeks Wound care R foot per Dr. Etter Sjogren, PA-C [facility] [phone] 01/31/2018 9:08 AM

## 2018-01-31 NOTE — Evaluation (Signed)
Occupational Therapy Evaluation and Discharge Patient Details Name: Melanie Atkins MRN: [id number] DOB: Aug 25, 1951 Today's Date: 01/31/2018    History of Present Illness Pt is a 67 y.o. female with history of uncontrolled diabetes mellitus and hypertension. Presented to the ED as a direct admission from her PCP where she was evaluated for toe ulcer. 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   Clinical Impression   PTA, pt was independent with ADL and functional mobility. She currently presents to OT with decreased ability to maintain TWB precautions and requiring supervision for toilet transfers and LB ADL. Pt educated concerning edema management strategies, safe toilet transfers, use of wash-ups rather than showering until cleared by MD, activity progression post-operatively, car transfers, and compensatory ADL strategies. Pt verbalizes understanding but continues to require cues for TWB status. She is very anxious to return home as soon as possible. No further acute OT needs identified and OT will sign off. Thank you for this referral!    Follow Up Recommendations  No OT follow up;Supervision/Assistance - 24 hour(initial assistance)    Equipment Recommendations  None recommended by OT    Recommendations for Other Services       Precautions / Restrictions Precautions Precautions: Fall Restrictions Weight Bearing Restrictions: Yes RLE Weight Bearing: Touchdown weight bearing Other Position/Activity Restrictions: in post-op shoe      Mobility Bed Mobility               General bed mobility comments: Standing in doorway on my arrival.   Transfers Overall transfer level: Needs assistance Equipment used: Rolling walker (2 wheeled) Transfers: Sit to/from Stand Sit to Stand: Supervision         General transfer comment:  Cues for TWB status    Balance Overall balance assessment: Mild deficits observed, not formally tested                                         ADL either performed or assessed with clinical judgement   ADL Overall ADL's : Needs assistance/impaired Eating/Feeding: Modified independent   Grooming: Modified independent   Upper Body Bathing: Modified independent   Lower Body Bathing: Modified independent   Upper Body Dressing : Modified independent   Lower Body Dressing: Modified independent   Toilet Transfer: Copy Details (indicate cue type and reason): cues for TWB Toileting- Clothing Manipulation and Hygiene: Supervision/safety;Sit to/from stand       Functional mobility during ADLs: Supervision/safety General ADL Comments: Supervision for safety with ADL and functional mobility. Difficulty adhering to TWB status despite multiple cues.      Vision Baseline Vision/History: Wears glasses Patient Visual Report: No change from baseline Vision Assessment?: No apparent visual deficits Additional Comments: Wearing glasses during assessment. No functional deficits noted.      Perception     Praxis      Pertinent Vitals/Pain Pain Assessment: No/denies pain     Hand Dominance     Extremity/Trunk Assessment Upper Extremity Assessment Upper Extremity Assessment: Overall WFL for tasks assessed   Lower Extremity Assessment Lower Extremity Assessment: RLE deficits/detail RLE Deficits / Details: s/p R second ray amputation       Communication Communication Communication: No difficulties   Cognition Arousal/Alertness: Awake/alert Behavior During Therapy: WFL for tasks assessed/performed Overall Cognitive Status: Within  Functional Limits for tasks assessed                                     General Comments  Educated pt on elevation and ice for edema management. Educated pt on TWB status as well as car  transfers.     Exercises     Shoulder Instructions      Home Living Family/patient expects to be discharged to:: Private residence Living Arrangements: Spouse/significant other Available Help at Discharge: Family;Available 24 hours/day Type of Home: House       Home Layout: Two level;Bed/bath upstairs Alternate Level Stairs-Number of Steps: flight   Bathroom Shower/Tub: Tub/shower unit;Walk-in Psychologist, prison and probation services: Standard     Home Equipment: Shower seat - built in(borrowing RW from friend)          Prior Functioning/Environment Level of Independence: Independent        Comments: drives, completely independent        OT Problem List: Decreased strength;Decreased activity tolerance;Decreased safety awareness;Decreased knowledge of precautions;Decreased knowledge of use of DME or AE      OT Treatment/Interventions:      OT Goals(Current goals can be found in the care plan section) Acute Rehab OT Goals Patient Stated Goal: go home right now OT Goal Formulation: With patient Time For Goal Achievement: 02/14/18 Potential to Achieve Goals: Good  OT Frequency:     Barriers to D/C:            Co-evaluation              AM-PAC PT "6 Clicks" Daily Activity     Outcome Measure Help from another person eating meals?: None Help from another person taking care of personal grooming?: None Help from another person toileting, which includes using toliet, bedpan, or urinal?: A Little Help from another person bathing (including washing, rinsing, drying)?: A Little Help from another person to put on and taking off regular upper body clothing?: None Help from another person to put on and taking off regular lower body clothing?: A Little 6 Click Score: 21   End of Session Equipment Utilized During Treatment: Rolling walker Nurse Communication: Mobility status(OT eval complete)  Activity Tolerance: Patient tolerated treatment well Patient left: in bed;with  call bell/phone within reach(seated at EOB)  OT Visit Diagnosis: Other abnormalities of gait and mobility (R26.89)                Time: 7035-0093 OT Time Calculation (min): 18 min Charges:  OT General Charges $OT Visit: 1 Visit OT Evaluation $OT Eval Low Complexity: 1 Low G-Codes:     Norman Herrlich, MS OTR/L  Pager: [place] A Anav Lammert 01/31/2018, 11:41 AM

## 2018-01-31 NOTE — Discharge Summary (Signed)
Physician Discharge Summary  Melanie Atkins GMW:[id number] DOB: 06-10-1951 DOA: 01/27/2018  PCP: Aretta Nip, MD  Admit date: 01/27/2018 Discharge date: 01/31/2018  Admitted From:home Disposition:home  Recommendations for Outpatient Follow-up:  1. Follow up with PCP in 1-2 weeks 2. Please obtain BMP/CBC in one week   Home Health:no Equipment/Devices:walker Discharge Condition:stable CODE STATUS:full code Diet recommendation:carb modified heart healthy diet  Brief/Interim Summary: 67 year old obese female with history of uncontrolled diabetes mellitus (hemoglobin A1c >9), hypertension with 2 weeks history of right second toe ulcer associated with increased swelling, erythema and drainage. No prior history of ulcer or similar symptoms. Patient seen by PCP and was sent to the hospital for direct admission for infected diabetic foot ulcer. Patient was afebrile with normal WBC. X-ray of the right foot showed osteomyelitis of the right second toe.  #Diabetic osteomyelitis of right second toe:  -Patient was evaluated by both vascular surgeon and orthopedics.  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  She had angioplasty with improvement in blood flow.  The drug-eluting balloon stent was placed.  Subsequently patient underwent right toe amputation by Dr. Sharol Given.  Patient is clinically improved.  She is very eager to go home today.  Patient reported she has a walker and has no difficulty walking.  Has pain therefore prescribed brief number of prescription for hydrocodone.  Educated patient not to drive for involving heavy activities while on pain medication.  She will follow-up closely with vascular surgeon and orthopedics.  She is on aspirin Plavix and statin.  I educated patient on tight control of her diabetes.  She verbalized understanding.  # Essential Hypertension: Monitor blood  pressure.  On benazepril and hydrochlorothiazide at home  #Uncontrolled DM type II: Continue current insulin regimen.  Monitor blood sugar level. Resume home dose of insulin on discharge.  # Obesity: Education.    Discharge Diagnoses:  Principal Problem:   Diabetic foot infection ([place]) Active Problems:   Essential hypertension   Uncontrolled type II diabetes mellitus ([place])   Subacute osteomyelitis, right ankle and foot [facility])    Discharge Instructions  Discharge Instructions    Call MD for:  difficulty breathing, headache or visual disturbances   Complete by:  As directed    Call MD for:  extreme fatigue   Complete by:  As directed    Call MD for:  hives   Complete by:  As directed    Call MD for:  persistant dizziness or light-headedness   Complete by:  As directed    Call MD for:  persistant nausea and vomiting   Complete by:  As directed    Call MD for:  severe uncontrolled pain   Complete by:  As directed    Call MD for:  temperature >100.4   Complete by:  As directed    Diet - low sodium heart healthy   Complete by:  As directed    Increase activity slowly   Complete by:  As directed    Weightbearing: Touchdown weightbearing on the right.     Allergies as of 01/31/2018   No Known Allergies     Medication List    TAKE these medications   acetaminophen 325 MG tablet Commonly known as:  TYLENOL Take 2 tablets (650 mg total) by mouth every 6 (six) hours as needed for mild pain (pain score 1-3 or temp > 100.5).   aspirin 81 MG EC tablet Take  1 tablet (81 mg total) by mouth daily. Start taking on:  02/01/2018 What changed:    medication strength  how much to take  when to take this  reasons to take this   atorvastatin 80 MG tablet Commonly known as:  LIPITOR Take 80 mg by mouth daily.   benazepril-hydrochlorthiazide 20-25 MG tablet Commonly known as:  LOTENSIN HCT Take 1 tablet by mouth daily.   clopidogrel 75 MG tablet Commonly known as:   PLAVIX Take 1 tablet (75 mg total) by mouth daily with breakfast. Start taking on:  02/01/2018   Fish Oil 1000 MG Caps Take 1,000 mg by mouth every morning.   GLUCOS-CHONDROIT-CA-MG-C-D PO Take 1 tablet by mouth every morning.   HYDROcodone-acetaminophen 7.5-325 MG tablet Commonly known as:  NORCO Take 1 tablet by mouth every 6 (six) hours as needed for severe pain (pain score 7-10).   JANUVIA 100 MG tablet Generic drug:  sitaGLIPtin Take 100 mg by mouth daily.   LEVEMIR FLEXTOUCH 100 UNIT/ML Pen Generic drug:  Insulin Detemir Inject 40 Units into the skin daily at 10 pm.   metFORMIN 500 MG 24 hr tablet Commonly known as:  GLUCOPHAGE-XR Take 2,000 mg by mouth daily with supper.   methocarbamol 500 MG tablet Commonly known as:  ROBAXIN Take 1 tablet (500 mg total) by mouth every 8 (eight) hours as needed for muscle spasms.   polyethylene glycol packet Commonly known as:  MIRALAX / GLYCOLAX Take 17 g by mouth daily as needed for mild constipation.            Durable Medical Equipment  (From admission, onward)        Start     Ordered   01/31/18 1036  For home use only DME Walker rolling  [facility])  Once    Question:  Patient needs a walker to treat with the following condition  Answer:  Leg pain   01/31/18 1035     Follow-up Information    [name], MD Follow up in 1 week(s).   Specialty:  Orthopedic Surgery Contact information: [place] [address] [phone]        Elam Dutch, MD Follow up in 5 week(s).   Specialties:  Vascular Surgery, Cardiology Contact information: [address] [place] [address] [phone]        Aretta Nip, MD. Schedule an appointment as soon as possible for a visit in 1 week(s).   Specialty:  Family Medicine Contact information: [place] [address] [phone]          No Known Allergies  Consultations: Vascular  surgeon Orthopedics  Procedures/Studies: Vascular procedure and amputation as above  Subjective: Seen and examined at bedside.  Very eager to go home today.  Denies headache, dizziness, nausea vomiting chest pain shortness of breath.  Pain is controlled with current regimen.  Discharge Exam: Vitals:   01/31/18 0158 01/31/18 0620  BP: (!) 149/83 (!) 155/86  Pulse:  87  Resp: 14 (!) 8  Temp: 97.6 F (36.4 C) 97.9 F (36.6 C)  SpO2: 96% 97%   Vitals:   01/30/18 1615 01/30/18 1636 01/31/18 0158 01/31/18 0620  BP: 136/67 (!) 134/55 (!) 149/83 (!) 155/86  Pulse: 83 81  87  Resp: 18 13 14  (!) 8  Temp: (!) 97.5 F (36.4 C) 97.7 F (36.5 C) 97.6 F (36.4 C) 97.9 F (36.6 C)  TempSrc:  Oral Oral Oral  SpO2: 97% 98% 96%  97%  Weight:      Height:        General: Pt is alert, awake, not in acute distress Cardiovascular: RRR, S1/S2 +, no rubs, no gallops Respiratory: CTA bilaterally, no wheezing, no rhonchi Abdominal: Soft, NT, ND, bowel sounds + Extremities: Right foot with dressing applied and support, no sign of bleeding noticed.    The results of significant diagnostics from this hospitalization (including imaging, microbiology, ancillary and laboratory) are listed below for reference.     Microbiology: Recent Results (from the past 240 hour(s))  Blood Cultures x 2 sites     Status: None (Preliminary result)   Collection Time: 01/28/18  1:00 AM  Result Value Ref Range Status   Specimen Description BLOOD RIGHT ARM  Final   Special Requests   Final    BOTTLES DRAWN AEROBIC AND ANAEROBIC Blood Culture results may not be optimal due to an excessive volume of blood received in culture bottles   Culture   Final    NO GROWTH 2 DAYS Performed at [facility], [place] [address] [place], [place] [address]    Report Status PENDING  Incomplete  Blood Cultures x 2 sites     Status: None (Preliminary result)   Collection Time: 01/28/18  1:06 AM  Result Value Ref Range Status    Specimen Description BLOOD LEFT ARM  Final   Special Requests   Final    BOTTLES DRAWN AEROBIC AND ANAEROBIC Blood Culture adequate volume   Culture   Final    NO GROWTH 2 DAYS Performed at [facility], [address], [place],  [address]    Report Status PENDING  Incomplete  Surgical PCR screen     Status: None   Collection Time: 01/29/18 11:16 PM  Result Value Ref Range Status   MRSA, PCR NEGATIVE NEGATIVE Final   Staphylococcus aureus NEGATIVE NEGATIVE Final    Comment: (NOTE) The Xpert SA Assay (FDA approved for NASAL specimens in patients 39 years of age and older), is one component of a comprehensive surveillance program. It is not intended to diagnose infection nor to guide or monitor treatment. Performed at [facility], [place] [address] [place],  [address]      Labs: BNP (last 3 results) No results for input(s): BNP in the last 8760 hours. Basic Metabolic Panel: Recent Labs  Lab 01/28/18 0102 01/31/18 0302  NA 136 134*  K 3.7 3.8  CL 96* 98*  CO2 25 26  GLUCOSE 180* 225*  BUN 12 9  CREATININE 0.64 0.61  CALCIUM 9.3 8.3*   Liver Function Tests: Recent Labs  Lab 01/28/18 0102  AST 25  ALT 30  ALKPHOS 102  BILITOT 0.6  PROT 7.0  ALBUMIN 3.0*   No results for input(s): LIPASE, AMYLASE in the last 168 hours. No results for input(s): AMMONIA in the last 168 hours. CBC: Recent Labs  Lab 01/28/18 0102 01/31/18 0302  WBC 6.9 8.7  NEUTROABS 4.3  --   HGB 12.6 11.6*  HCT 38.2 35.6*  MCV 89.5 89.7  PLT 338 333   Cardiac Enzymes: No results for input(s): CKTOTAL, CKMB, CKMBINDEX, TROPONINI in the last 168 hours. BNP: Invalid input(s): POCBNP CBG: Recent Labs  Lab 01/30/18 1319 01/30/18 1508 01/30/18 1651 01/30/18 2118 01/31/18 0616  GLUCAP 224* 224* 198* 275* 210*   D-Dimer No results for input(s): DDIMER in the last 72 hours. Hgb A1c No results for input(s): HGBA1C in the last 72 hours.  Lipid Profile No  results for input(s): CHOL, HDL, LDLCALC, TRIG, CHOLHDL, LDLDIRECT in the last 72 hours. Thyroid function studies No results for input(s): TSH, T4TOTAL, T3FREE, THYROIDAB in the last 72 hours.  Invalid input(s): FREET3 Anemia work up No results for input(s): VITAMINB12, FOLATE, FERRITIN, TIBC, IRON, RETICCTPCT in the last 72 hours. Urinalysis No results found for: COLORURINE, APPEARANCEUR, [place], [place], [place], [place], [place], [place], PROTEINUR, UROBILINOGEN, NITRITE, LEUKOCYTESUR Sepsis Labs Invalid input(s): PROCALCITONIN,  WBC,  LACTICIDVEN Microbiology Recent Results (from the past 240 hour(s))  Blood Cultures x 2 sites     Status: None (Preliminary result)   Collection Time: 01/28/18  1:00 AM  Result Value Ref Range Status   Specimen Description BLOOD RIGHT ARM  Final   Special Requests   Final    BOTTLES DRAWN AEROBIC AND ANAEROBIC Blood Culture results may not be optimal due to an excessive volume of blood received in culture bottles   Culture   Final    NO GROWTH 2 DAYS Performed at [facility], [place] [address] [place], [place] [address]    Report Status PENDING  Incomplete  Blood Cultures x 2 sites     Status: None (Preliminary result)   Collection Time: 01/28/18  1:06 AM  Result Value Ref Range Status   Specimen Description BLOOD LEFT ARM  Final   Special Requests   Final    BOTTLES DRAWN AEROBIC AND ANAEROBIC Blood Culture adequate volume   Culture   Final    NO GROWTH 2 DAYS Performed at [facility], [address] [place], [place] [address]    Report Status PENDING  Incomplete  Surgical PCR screen     Status: None   Collection Time: 01/29/18 11:16 PM  Result Value Ref Range Status   MRSA, PCR NEGATIVE NEGATIVE Final   Staphylococcus aureus NEGATIVE NEGATIVE Final    Comment: (NOTE) The Xpert SA Assay (FDA approved for NASAL specimens in patients 94 years of age and older), is one component of a comprehensive surveillance program. It  is not intended to diagnose infection nor to guide or monitor treatment. Performed at [facility], [place] [address] [place], [place] [address]      Time coordinating discharge: 28 minutes  SIGNED:   Rosita Fire, MD  [facility] 01/31/2018, 10:36 AM  If 7PM-7AM, please contact night-coverage [email or web address] Password [email or web address]

## 2018-01-31 NOTE — Evaluation (Signed)
Physical Therapy Evaluation Patient Details Name: Melanie Atkins MRN: [id number] DOB: [birth year] Today's Date: 01/31/2018   History of Present Illness  Pt is a 67 y.o. female with history of uncontrolled diabetes mellitus and hypertension. Presented to the ED as a direct admission from her PCP where she was evaluated for toe ulcer. 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  Clinical Impression  Pt is not at baseline functioning, but should be safe at home with husband's assist.  All education is completed, pt has negotiated the stairs at [facility] if not TDWB.  Pt will bias her weight back on her heel as she can't consistently maintain TDWB.[name] There are no further acute PT needs.  Will sign off at this time.     Follow Up Recommendations No PT follow up    Equipment Recommendations       Recommendations for Other Services       Precautions / Restrictions Precautions Precautions: Fall Restrictions Weight Bearing Restrictions: Yes RLE Weight Bearing: Touchdown weight bearing Other Position/Activity Restrictions: in post-op shoe      Mobility  Bed Mobility               General bed mobility comments: sitting EOB on arrival  Transfers Overall transfer level: Needs assistance Equipment used: Rolling walker (2 wheeled) Transfers: Sit to/from Stand Sit to Stand: Supervision         General transfer comment: Cues for TWB status and cues to weight heel if unable to attain TDWB  Ambulation/Gait Ambulation/Gait assistance: Min guard;Supervision Ambulation Distance (Feet): 30 Feet(at a time before starts putting extra weight) Assistive device: Rolling walker (2 wheeled) Gait Pattern/deviations: Step-to pattern   Gait velocity interpretation: Below normal speed for age/gender General Gait Details: short step to pattern, pt  attempting to weightbear onto heel in lieu of not able to maintain TDWB.  Pt can maintain PWB through her heel  Stairs Stairs: Yes Stairs assistance: Min guard Stair Management: One rail Left;Step to pattern;Backwards;Forwards;With walker Number of Stairs: 6 General stair comments: Pt went up backward so she could WB through heel only and the forefoot would  not be on the step.  She came down forward attempting to keep forefoot off the step and on heel only.  Again unable to [facility], but could do PWB.  Wheelchair Mobility    Modified Rankin (Stroke Patients Only)       Balance Overall balance assessment: Mild deficits observed, not formally tested                                           Pertinent Vitals/Pain Pain Assessment: No/denies pain    Home Living Family/patient expects to be discharged to:: Private residence Living Arrangements: Spouse/significant other Available Help at Discharge: Family;Available 24 hours/day Type of Home: House       Home Layout: Two level;Bed/bath upstairs Home Equipment: Shower seat - built in(borrowing RW from friend)      Prior Function Level of Independence: Independent         Comments: drives, completely independent     Hand Dominance        Extremity/Trunk Assessment   Upper Extremity Assessment Upper Extremity Assessment: Overall WFL for tasks assessed    Lower Extremity Assessment Lower Extremity Assessment: Overall  WFL for tasks assessed RLE Deficits / Details: s/p R second ray amputation       Communication   Communication: No difficulties  Cognition Arousal/Alertness: Awake/alert Behavior During Therapy: WFL for tasks assessed/performed;Anxious Overall Cognitive Status: Within Functional Limits for tasks assessed                                        General Comments General comments (skin integrity, edema, etc.): Educated pt on elevation and ice for edema management.  Educated pt on TWB status as well as car transfers.  Stressed biasing weight toward heel since she could not maintain TDWB consistently    Exercises     Assessment/Plan    PT Assessment Patent does not need any further PT services  PT Problem List         PT Treatment Interventions      PT Goals (Current goals can be found in the Care Plan section)  Acute Rehab PT Goals Patient Stated Goal: go home right now PT Goal Formulation: With patient    Frequency     Barriers to discharge        Co-evaluation               AM-PAC PT "6 Clicks" Daily Activity  Outcome Measure Difficulty turning over in bed (including adjusting bedclothes, sheets and blankets)?: None Difficulty moving from lying on back to sitting on the side of the bed? : None Difficulty sitting down on and standing up from a chair with arms (e.g., wheelchair, bedside commode, etc,.)?: A Lot Help needed moving to and from a bed to chair (including a wheelchair)?: A Little Help needed walking in hospital room?: A Little Help needed climbing 3-5 steps with a railing? : A Little 6 Click Score: 19    End of Session   Activity Tolerance: Patient tolerated treatment well Patient left: in chair;with call bell/phone within reach Nurse Communication: Mobility status PT Visit Diagnosis: Other abnormalities of gait and mobility (R26.89);Difficulty in walking, not elsewhere classified (R26.2)    Time: 0998-3382 PT Time Calculation (min) (ACUTE ONLY): 31 min   Charges:   PT Evaluation $PT Eval Moderate Complexity: 1 Mod PT Treatments $Gait Training: 8-22 mins   PT G Codes:        18-Feb-2018  Donnella Sham, PT [phone]  (pager)  [name] Feb 18, 2018, 12:50 PM

## 2018-01-31 NOTE — Progress Notes (Signed)
Pt alert and oriented times 4, keep complaining every time staff go in to check on her stated "no one can sleep here," this nurse had to talk to her after she refuse lab drawn I explain to her that since she wanted to leave tomorrow that it was very important that we draw her lab, she said she taught her sleep was more important than that but eventually let us proceed.

## 2018-01-31 NOTE — Progress Notes (Signed)
DME - walker ordered as requested, CM spoke to [facility] with [place]; DME to be delivered to the room today prior to discharging home; Aneta Mins [phone]

## 2018-01-31 NOTE — Progress Notes (Signed)
Pt discharged home with husband. IV removed. Pt received discharge instructions and all questions were answered. Pt received paper prescriptions, including Norco, and was instructed to get them filled at her pharmacy. Pt verbalized understanding. Pt stated that a friend was lending her a rolling walker and shower chair. Pt left with all of her belongings. Pt discharged via wheelchair and was accompanied by pt's RN.   Grant Fontana BSN, RN

## 2018-01-31 NOTE — Discharge Instructions (Signed)
Fingerstick glucose (sugar) goals for home: Before meals: 80-130 mg/dl 2-Hours after meals: less than 180 mg/dl Hemoglobin A1c goal: 7% or less    [facility]  Discharge Instructions  Lower Extremity Angiogram; Angioplasty/Stenting  Please refer to the following instructions for your post-procedure care. Your surgeon or physician assistant will discuss any changes with you.  Activity  Avoid lifting more than 8 pounds (1 gallons of milk) for 72 hours (3 days) after your procedure. You may walk as much as you can tolerate. It's OK to drive after 72 hours.  Bathing/Showering  You may shower the day after your procedure. If you have a bandage, you may remove it at 24- 48 hours. Clean your incision site with mild soap and water. Pat the area dry with a clean towel.  Diet  Resume your pre-procedure diet. There are no special food restrictions following this procedure. All patients with peripheral vascular disease should follow a low fat/low cholesterol diet. In order to heal from your surgery, it is CRITICAL to get adequate nutrition. Your body requires vitamins, minerals, and protein. Vegetables are the best source of vitamins and minerals. Vegetables also provide the perfect balance of protein. Processed food has little nutritional value, so try to avoid this.  Medications  Resume taking all of your medications unless your doctor tells you not to. If your incision is causing pain, you may take over-the-counter pain relievers such as acetaminophen (Tylenol)  Follow Up  Follow up will be arranged at the time of your procedure. You may have an office visit scheduled or may be scheduled for surgery. Ask your surgeon if you have any questions.  Please call us immediately for any of the following conditions: Severe or worsening pain your legs or feet at rest or with walking. Increased pain, redness, drainage at your groin puncture site. Fever of 101  degrees or higher. If you have any mild or slow bleeding from your puncture site: lie down, apply firm constant pressure over the area with a piece of gauze or a clean wash cloth for 30 minutes- no peeking!, call 911 right away if you are still bleeding after 30 minutes, or if the bleeding is heavy and unmanageable.  Reduce your risk factors of vascular disease:  Stop smoking. If you would like help call [facility] at 1-800-QUIT-NOW (703)447-9635) or [facility] at [phone]. Manage your cholesterol Maintain a desired weight Control your diabetes Keep your blood pressure down  If you have any questions, please call the office at [phone]

## 2018-02-02 ENCOUNTER — Encounter (HOSPITAL_COMMUNITY): Payer: Self-pay | Admitting: Vascular Surgery

## 2018-02-02 ENCOUNTER — Telehealth (INDEPENDENT_AMBULATORY_CARE_PROVIDER_SITE_OTHER): Payer: Self-pay | Admitting: Orthopedic Surgery

## 2018-02-02 LAB — CULTURE, BLOOD (ROUTINE X 2)
Culture: NO GROWTH
Culture: NO GROWTH
Special Requests: ADEQUATE

## 2018-02-02 MED FILL — Heparin Sodium (Porcine) 2 Unit/ML in Sodium Chloride 0.9%: INTRAMUSCULAR | Qty: 1000 | Status: AC

## 2018-02-02 MED FILL — Clopidogrel Bisulfate Tab 300 MG (Base Equiv): ORAL | Qty: 1 | Status: AC

## 2018-02-02 NOTE — Telephone Encounter (Signed)
Patient called wanting to know if she needs to stay "wrapped up" until her next appointment which is March the 25th.  Please advise patient.  CB#[phone].  Thank you.

## 2018-02-02 NOTE — Anesthesia Postprocedure Evaluation (Signed)
Anesthesia Post Note  Patient: Melanie Atkins  Procedure(s) Performed: RIGHT FOOT 2ND RAY AMPUTATION (Right Foot)     Patient location during evaluation: PACU Anesthesia Type: General Level of consciousness: sedated and patient cooperative Pain management: pain level controlled Vital Signs Assessment: post-procedure vital signs reviewed and stable Respiratory status: spontaneous breathing Cardiovascular status: stable Anesthetic complications: no    Last Vitals:  Vitals:   01/31/18 0158 01/31/18 0620  BP: (!) 149/83 (!) 155/86  Pulse:  87  Resp: 14 (!) 8  Temp: 36.4 C 36.6 C  SpO2: 96% 97%    Last Pain:  Vitals:   01/31/18 0902  TempSrc:   PainSc: 0-No pain                 Nolon Nations

## 2018-02-02 NOTE — Telephone Encounter (Signed)
Please call pt to advise that she needs to have nurse only visit this Wednesday with me for dressing removal.

## 2018-02-04 ENCOUNTER — Telehealth: Payer: Self-pay | Admitting: Vascular Surgery

## 2018-02-04 NOTE — Telephone Encounter (Signed)
Left pt vm to confirm appt. Mailed letter

## 2018-02-04 NOTE — Telephone Encounter (Signed)
-----   Message from Mena Goes, RN sent at 01/30/2018  2:09 PM EDT ----- Regarding: 2-4 weeks    ----- Message ----- From: Elam Dutch, MD Sent: 01/30/2018   9:33 AM To: Vvs Charge Pool  Aortogram with bilat runoff. [place] groin Right SFA stent x 2 after drug coated balloon  She needs follow up with [facility] or [facility] with bilat ABI and duplex 2-4 weeks  Ruta Hinds

## 2018-02-05 NOTE — Telephone Encounter (Signed)
I called and lm on vm to advise that I would be happy to work the pt on the sch for an appt tomorrow to remove the dressing and then r/s her appt with Dr. Sharol Given to Thursday of next week or if the bandage is not soiled and she wants to keep it on through the weekend we can leave her appt sch for Monday. I will hold this message and have the pt advise me of what it is that she would like to do

## 2018-02-05 NOTE — Telephone Encounter (Signed)
Tried to reach pt no answer. I will hold message and try again later. I will offer pt appt for tomorrow with me and make appt for Dr. Sharol Given next Thursday or she can keep her appt for Monday if the dressing is still intact and not soiled.

## 2018-02-05 NOTE — Telephone Encounter (Signed)
I am just now seeing this message from, I am sorry that I did not get her on your schedule for this week, did you want me to try and schedule her for the 27th?

## 2018-02-06 NOTE — Telephone Encounter (Signed)
I called and lm on vm again to request that she call the office and advise what iti is that she would like to do.

## 2018-02-09 ENCOUNTER — Encounter (INDEPENDENT_AMBULATORY_CARE_PROVIDER_SITE_OTHER): Payer: Self-pay | Admitting: Orthopedic Surgery

## 2018-02-09 ENCOUNTER — Ambulatory Visit (INDEPENDENT_AMBULATORY_CARE_PROVIDER_SITE_OTHER): Payer: 59 | Admitting: Orthopedic Surgery

## 2018-02-09 ENCOUNTER — Encounter: Payer: 59 | Admitting: Family

## 2018-02-09 ENCOUNTER — Encounter (HOSPITAL_COMMUNITY): Payer: 59

## 2018-02-09 VITALS — Ht 68.0 in | Wt 242.0 lb

## 2018-02-09 DIAGNOSIS — S98131A Complete traumatic amputation of one right lesser toe, initial encounter: Secondary | ICD-10-CM | POA: Insufficient documentation

## 2018-02-09 DIAGNOSIS — Z89421 Acquired absence of other right toe(s): Secondary | ICD-10-CM

## 2018-02-09 NOTE — Telephone Encounter (Signed)
Pt has appt today

## 2018-02-09 NOTE — Progress Notes (Signed)
Office Visit Note   Patient: Melanie Atkins           Date of Birth: 04-Feb-1951           MRN: [id number] Visit Date: 02/09/2018              Requested by: Aretta Nip, [place], [place] [address] PCP: Aretta Nip, MD  Chief Complaint  Patient presents with  . Right Foot - Routine Post Op    01/30/18 right foot 2nd ray amputation       HPI: Patient is a 67 year old woman status post right foot second ray amputation she has no complaints.  Assessment & Plan: Visit Diagnoses:  1. Amputated toe of right foot ([place])     Plan: We will start Dial soap cleansing continue protected weightbearing dry dressing change daily follow-up in the office in 1 week to remove the sutures.  Recommended against driving.  Follow-Up Instructions: Return in about 1 week (around 02/16/2018).   Ortho Exam  Patient is alert, oriented, no adenopathy, well-dressed, normal affect, normal respiratory effort. Examination patient does have some swelling the wound edges are well approximated there is no drainage no cellulitis no signs of infection.  Patient was given instructions for heel cord stretching.  Imaging: No results found. No images are attached to the encounter.  Labs: Lab Results  Component Value Date   HGBA1C 9.5 (H) 01/28/2018   ESRSEDRATE 92 (H) 01/28/2018   ESRSEDRATE 12 12/10/2011   CRP 6.5 (H) 01/28/2018   REPTSTATUS 02/02/2018 FINAL 01/28/2018   CULT  01/28/2018    NO GROWTH 5 DAYS Performed at [facility], [place] [address] [place], [place] [address]     @[email or web address] [phone]  Body mass index is 36.8 kg/m.  Orders:  No orders of the defined types were placed in this encounter.  No orders of the defined types were placed in this encounter.    Procedures: No procedures performed  Clinical Data: No additional findings.  ROS:  All other systems negative, except as noted in the HPI. Review of Systems  Objective: Vital  Signs: Ht 5\' 8"  (1.727 m)   Wt 242 lb (109.8 kg)   BMI 36.80 kg/m   Specialty Comments:  No specialty comments available.  PMFS History: Patient Active Problem List   Diagnosis Date Noted  . Amputated toe of right foot ([place]) 02/09/2018  . Subacute osteomyelitis, right ankle and foot ([place])   . Diabetic foot infection ([place]) 01/27/2018  . Essential hypertension 01/27/2018  . Uncontrolled type II diabetes mellitus ([place]) 01/27/2018  . Hairy cell leukemia ([place]) 11/30/2014   Past Medical History:  Diagnosis Date  . 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 High cholesterol   . History of blood transfusion ~ 2002   "related to hairy cell leukemia"  . Hypertension   . Type II diabetes mellitus ([place])     History reviewed. No pertinent family history.  Past Surgical History:  Procedure Laterality Date  . ABDOMINAL AORTOGRAM W/LOWER EXTREMITY Right 01/30/2018   Procedure: ABDOMINAL AORTOGRAM W/LOWER EXTREMITY;  Surgeon: Elam Dutch, MD;  Location: [facility];  Service: Cardiovascular;  Laterality: Right;  . ABDOMINAL HYSTERECTOMY    . AMPUTATION Right 01/30/2018   Procedure: RIGHT FOOT 2ND RAY AMPUTATION;  Surgeon: [name], MD;  Location: [facility];  Service: Orthopedics;  Laterality: Right;  . CESAREAN SECTION  1994  . PERIPHERAL VASCULAR INTERVENTION Right 01/30/2018  Procedure: PERIPHERAL VASCULAR INTERVENTION;  Surgeon: Elam Dutch, MD;  Location: [facility];  Service: Cardiovascular;  Laterality: Right;  . TONSILLECTOMY     Social History   Occupational History  . Not on file  Tobacco Use  . Smoking status: Former Smoker    Packs/day: 0.50    Years: 4.00    Pack years: 2.00    Types: Cigarettes    Last attempt to quit: 1978    Years since quitting: 41.2  . Smokeless tobacco: Never Used  Substance and Sexual Activity  . Alcohol use: Yes    Alcohol/week: 2.4 oz    Types: 4 Glasses of wine per week  . Drug use: No  . Sexual activity: Not  Currently

## 2018-02-16 ENCOUNTER — Ambulatory Visit (INDEPENDENT_AMBULATORY_CARE_PROVIDER_SITE_OTHER): Payer: 59 | Admitting: Orthopedic Surgery

## 2018-02-16 ENCOUNTER — Encounter (INDEPENDENT_AMBULATORY_CARE_PROVIDER_SITE_OTHER): Payer: Self-pay | Admitting: Orthopedic Surgery

## 2018-02-16 ENCOUNTER — Other Ambulatory Visit: Payer: Self-pay

## 2018-02-16 VITALS — Ht 68.0 in | Wt 242.0 lb

## 2018-02-16 DIAGNOSIS — I739 Peripheral vascular disease, unspecified: Secondary | ICD-10-CM

## 2018-02-16 DIAGNOSIS — S98131A Complete traumatic amputation of one right lesser toe, initial encounter: Secondary | ICD-10-CM

## 2018-02-16 DIAGNOSIS — M86271 Subacute osteomyelitis, right ankle and foot: Secondary | ICD-10-CM

## 2018-02-16 NOTE — Progress Notes (Signed)
Office Visit Note   Patient: Melanie Atkins           Date of Birth: Feb 02, 1951           MRN: [id number] Visit Date: 02/16/2018              Requested by: Aretta Nip, [place], [place] [address] PCP: Aretta Nip, MD  Chief Complaint  Patient presents with  . Right Foot - Routine Post Op    01/30/18 right foot 2nd ray amputation      HPI: Patient is a 67 year old woman who presents 2 weeks status post right foot second ray amputation.  Patient started Dial soap cleansing dry dressing detected weightbearing in a postoperative shoe she uses a rolling walker.  Assessment & Plan: Visit Diagnoses:  1. Amputated toe of right foot ([place])     Plan: Patient will advance to her soft soled shoes increase her activities as tolerated recommended knee-high compression stockings to help with the venous swelling.  Follow-Up Instructions: Return if symptoms worsen or fail to improve.   Ortho Exam  Patient is alert, oriented, no adenopathy, well-dressed, normal affect, normal respiratory effort. Examination the incision is well-healed there is no redness no cellulitis she does have venous swelling in the leg foot and ankle.  There is no signs of infection.  Imaging: No results found. No images are attached to the encounter.  Labs: Lab Results  Component Value Date   HGBA1C 9.5 (H) 01/28/2018   ESRSEDRATE 92 (H) 01/28/2018   ESRSEDRATE 12 12/10/2011   CRP 6.5 (H) 01/28/2018   REPTSTATUS 02/02/2018 FINAL 01/28/2018   CULT  01/28/2018    NO GROWTH 5 DAYS Performed at [facility], [place] [address], [place], [place] [address]     [email or web address] [phone]  Body mass index is 36.8 kg/m.  Orders:  No orders of the defined types were placed in this encounter.  No orders of the defined types were placed in this encounter.    Procedures: No procedures performed  Clinical Data: No additional findings.  ROS:  All other systems  negative, except as noted in the HPI. Review of Systems  Objective: Vital Signs: Ht 5\' 8"  (1.727 m)   Wt 242 lb (109.8 kg)   BMI 36.80 kg/m   Specialty Comments:  No specialty comments available.  PMFS History: Patient Active Problem List   Diagnosis Date Noted  . Amputated toe of right foot ([place]) 02/09/2018  . Subacute osteomyelitis, right ankle and foot ([place])   . Diabetic foot infection ([place]) 01/27/2018  . Essential hypertension 01/27/2018  . Uncontrolled type II diabetes mellitus ([place]) 01/27/2018  . Hairy cell leukemia ([place]) 11/30/2014   Past Medical History:  Diagnosis Date  . 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 High cholesterol   . History of blood transfusion ~ 2002   "related to hairy cell leukemia"  . Hypertension   . Type II diabetes mellitus ([place])     History reviewed. No pertinent family history.  Past Surgical History:  Procedure Laterality Date  . ABDOMINAL AORTOGRAM W/LOWER EXTREMITY Right 01/30/2018   Procedure: ABDOMINAL AORTOGRAM W/LOWER EXTREMITY;  Surgeon: Elam Dutch, MD;  Location: [facility];  Service: Cardiovascular;  Laterality: Right;  . ABDOMINAL HYSTERECTOMY    . AMPUTATION Right 01/30/2018   Procedure: RIGHT FOOT 2ND RAY AMPUTATION;  Surgeon: [name], MD;  Location: [facility];  Service: Orthopedics;  Laterality: Right;  . CESAREAN  SECTION  1994  . PERIPHERAL VASCULAR INTERVENTION Right 01/30/2018   Procedure: PERIPHERAL VASCULAR INTERVENTION;  Surgeon: Elam Dutch, MD;  Location: [facility];  Service: Cardiovascular;  Laterality: Right;  . TONSILLECTOMY     Social History   Occupational History  . Not on file  Tobacco Use  . Smoking status: Former Smoker    Packs/day: 0.50    Years: 4.00    Pack years: 2.00    Types: Cigarettes    Last attempt to quit: 1978    Years since quitting: 41.2  . Smokeless tobacco: Never Used  Substance and Sexual Activity  . Alcohol use: Yes    Alcohol/week: 2.4 oz    Types:  4 Glasses of wine per week  . Drug use: No  . Sexual activity: Not Currently

## 2018-02-24 ENCOUNTER — Ambulatory Visit (INDEPENDENT_AMBULATORY_CARE_PROVIDER_SITE_OTHER)
Admission: RE | Admit: 2018-02-24 | Discharge: 2018-02-24 | Disposition: A | Discharge status: Home / Self Care | Payer: 59 | Source: Ambulatory Visit | Attending: Vascular Surgery | Admitting: Vascular Surgery

## 2018-02-24 ENCOUNTER — Ambulatory Visit (HOSPITAL_COMMUNITY)
Admission: RE | Admit: 2018-02-24 | Discharge: 2018-02-24 | Disposition: A | Discharge status: Home / Self Care | Payer: 59 | Source: Ambulatory Visit | Attending: Vascular Surgery | Admitting: Vascular Surgery

## 2018-02-24 DIAGNOSIS — I739 Peripheral vascular disease, unspecified: Secondary | ICD-10-CM | POA: Diagnosis not present

## 2018-02-26 ENCOUNTER — Encounter: Payer: Self-pay | Admitting: Family

## 2018-02-26 ENCOUNTER — Ambulatory Visit (INDEPENDENT_AMBULATORY_CARE_PROVIDER_SITE_OTHER): Payer: 59 | Admitting: Family

## 2018-02-26 ENCOUNTER — Other Ambulatory Visit: Payer: Self-pay

## 2018-02-26 VITALS — BP 141/77 | HR 90 | Temp 97.2°F | Resp 20 | Ht 68.0 in | Wt 242.0 lb

## 2018-02-26 DIAGNOSIS — Z87891 Personal history of nicotine dependence: Secondary | ICD-10-CM

## 2018-02-26 DIAGNOSIS — I779 Disorder of arteries and arterioles, unspecified: Secondary | ICD-10-CM

## 2018-02-26 DIAGNOSIS — E1151 Type 2 diabetes mellitus with diabetic peripheral angiopathy without gangrene: Secondary | ICD-10-CM

## 2018-02-26 DIAGNOSIS — IMO0002 Reserved for concepts with insufficient information to code with codable children: Secondary | ICD-10-CM

## 2018-02-26 DIAGNOSIS — E1165 Type 2 diabetes mellitus with hyperglycemia: Secondary | ICD-10-CM

## 2018-02-26 NOTE — Progress Notes (Signed)
Postoperative Visit   History of Present Illness  Melanie Atkins is a 67 y.o. female who is s/p right superficial femoral artery angioplasty with drug-coated balloon (6 x 125) followed by stenting (7 x 100, 7 x 60 Inova) on 01-30-18 by Dr. Oneida Alar for nonhealing wound right foot. 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 Sharol Given.   She quit smoking in 1978. She has uncontrolled DM, last A1C result on file was 9.5 on 01-28-18.    She reports she can walk as far as she wants with no problems in her legs.   She denies any hx of stroke or TIA.   The patient's right 2nd toe amputation site is healed. The patient is able to complete their activities of daily living.     For VQI Use Only  PRE-ADM LIVING: Home  AMB STATUS: Ambulatory   Past Medical History:  Diagnosis Date  . 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 High cholesterol   . History of blood transfusion ~ 2002   "related to hairy cell leukemia"  . Hypertension   . Type II diabetes mellitus ([place])     Past Surgical History:  Procedure Laterality Date  . ABDOMINAL AORTOGRAM W/LOWER EXTREMITY Right 01/30/2018   Procedure: ABDOMINAL AORTOGRAM W/LOWER EXTREMITY;  Surgeon: Elam Dutch, MD;  Location: [facility];  Service: Cardiovascular;  Laterality: Right;  . ABDOMINAL HYSTERECTOMY    . AMPUTATION Right 01/30/2018   Procedure: RIGHT FOOT 2ND RAY AMPUTATION;  Surgeon: [name], MD;  Location: [facility];  Service: Orthopedics;  Laterality: Right;  . CESAREAN SECTION  1994  . PERIPHERAL VASCULAR INTERVENTION Right 01/30/2018   Procedure: PERIPHERAL VASCULAR INTERVENTION;  Surgeon: Elam Dutch, MD;  Location: [facility];  Service: Cardiovascular;  Laterality: Right;  . TONSILLECTOMY      Social History   Socioeconomic History  . Marital status: Married    Spouse name: Not on file  . Number of children: Not on file  . Years of education: Not on file  . Highest education level:  Not on file  Occupational History  . Not on file  Social Needs  . Financial resource strain: Not on file  . Food insecurity:    Worry: Not on file    Inability: Not on file  . Transportation needs:    Medical: Not on file    Non-medical: Not on file  Tobacco Use  . Smoking status: Former Smoker    Packs/day: 0.50    Years: 4.00    Pack years: 2.00    Types: Cigarettes    Last attempt to quit: 1978    Years since quitting: 41.3  . Smokeless tobacco: Never Used  Substance and Sexual Activity  . Alcohol use: Yes    Alcohol/week: 2.4 oz    Types: 4 Glasses of wine per week  . Drug use: No  . Sexual activity: Not Currently  Lifestyle  . Physical activity:    Days per week: Not on file    Minutes per session: Not on file  . Stress: Not on file  Relationships  . Social connections:    Talks on phone: Not on file    Gets together: Not on file    Attends religious service: Not on file    Active member of club or organization: Not on file    Attends meetings of clubs or organizations: Not on file  Relationship status: Not on file  . Intimate partner violence:    Fear of current or ex partner: Not on file    Emotionally abused: Not on file    Physically abused: Not on file    Forced sexual activity: Not on file  Other Topics Concern  . Not on file  Social History Narrative  . Not on file    No Known Allergies  Current Outpatient Medications on File Prior to Visit  Medication Sig Dispense Refill  . aspirin EC 81 MG EC tablet Take 1 tablet (81 mg total) by mouth daily. 30 tablet 0  . atorvastatin (LIPITOR) 80 MG tablet Take 80 mg by mouth daily.  0  . benazepril-hydrochlorthiazide (LOTENSIN HCT) 20-25 MG per tablet Take 1 tablet by mouth daily.     . clopidogrel (PLAVIX) 75 MG tablet Take 1 tablet (75 mg total) by mouth daily with breakfast. 30 tablet 0  . GLUCOS-CHONDROIT-CA-MG-C-D PO Take 1 tablet by mouth every morning.     [name] JANUVIA 100 MG tablet Take 100 mg by  mouth daily.  0  . LEVEMIR FLEXTOUCH 100 UNIT/ML Pen Inject 40 Units into the skin daily at 10 pm.     . metFORMIN (GLUCOPHAGE-XR) 500 MG 24 hr tablet Take 2,000 mg by mouth daily with supper.    . Omega-3 Fatty Acids (FISH OIL) 1000 MG CAPS Take 1,000 mg by mouth every morning.     . polyethylene glycol (MIRALAX / GLYCOLAX) packet Take 17 g by mouth daily as needed for mild constipation. 14 each 0   No current facility-administered medications on file prior to visit.      Physical Examination  Vitals:   02/26/18 0932 02/26/18 0935  BP: (!) 144/73 120/80  Pulse: 90   Resp: 20   Temp: (!) 97.2 F (36.2 C)   TempSrc: Oral   SpO2: 96%   Weight: 242 lb (109.8 kg)   Height: 5\' 8"  (1.727 m)    Body mass index is 36.8 kg/m.  PHYSICAL EXAMINATION: General: The patient appears their stated age. Obese female  HEENT:  No gross abnormalities Pulmonary: Respirations are non-labored, CTAB Abdomen: Soft and non-tender with normal BS Musculoskeletal: There are no major deformities. Well healed right second toe amputation site. Neurologic: No focal weakness or paresthesias are detected, CN 2-12 intact Skin: There are no ulcer or rashes noted. Psychiatric: The patient has normal affect. Cardiovascular: There is a regular rate and rhythm with significant murmur.  Vascular: Vessel Right Left  Radial 2+Palpable 2+Palpable  Brachial Palpable Palpable  Carotid Palpable, without bruit Palpable, without bruit  Aorta Not palpable N/A  Femoral Faintly Palpable (obese) Faintly Palpable  Popliteal Not palpable Not palpable  PT not Palpable  not Palpable  DP faintly Palpable faintly Palpable     DATA  Bilateral LE Arterial Duplex (02-25-18): Right LE stent with no significant stenosis  Left LE: A focal velocity elevation of 318 cm/s was obtained at SFA P-M with post stenotic turbulence with a VR of 2.9. Findings are characteristic of 50-74% stenosis. A 2nd focal velocity elevation was  visualized, measuring 362 cm/s at SFA M with post stenotic  turbulence with a VR of 3.29. Findings are characteristic of 50-74% stenosis. A 3rd focal velocity elevation was visualized, measuring 372 cm/s at SFA D with post stenotic turbulence with a VR of 3.18. Findings are characteristic of 50-74% stenosis.  Multiple areas of 50-74% stentosis noted throughout the SFA. Multiple areas of 50-74% stenosis noted in  the left SFA.   ABI (Date: 02/26/2018):  R:   ABI: 1.03 (was 0.50 on 01-28-18),   PT: bi  DP: bi  TBI:  0.62   L:   ABI: 0.79 (was 0.68),   PT: bi  DP: bi  TBI: 0.56  Improved bilateral ABI, no disease on the right, moderate disease in the left, all biphasic waveforms     Medical Decision Making  Melanie Atkins is a 67 y.o. female who presents s/p right superficial femoral artery angioplasty with drug-coated balloon (6 x 125) followed by stenting (7 x 100, 7 x 60 Inova) on 01-30-18 by Dr. Oneida Alar for nonhealing wound right foot. 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 Sharol Given; this has healed.   She quit smoking in 1978. She has uncontrolled DM, last A1C result on file was 9.5 on 01-28-18.    She reports she can walk as far as she wants with no problems in her legs.   Based on non duplex results, HPI, and physical exam results, pt will follow up in 3 months with ABI's and bilateral LE arterial duplex. I advised pt to notify us if she develops concerns re the circulation in her feet or legs.  I discussed in depth with the patient the nature of atherosclerosis, and emphasized the importance of maximal medical management including strict control of blood pressure, blood glucose, and lipid levels, obtaining regular exercise, and cessation of smoking.  The patient is aware that without maximal medical management the underlying atherosclerotic disease process will progress, limiting the benefit of any interventions. The patient is currently on a  statin. The patient is currently on dual anti-platelet therapy.  Thank you for allowing [place] to participate in this patient's care.  Clemon Chambers, RN, MSN, FNP-C [facility] Office: [phone]  02/26/2018, 9:37 AM  Clinic MD: Oneida Alar

## 2018-02-26 NOTE — Patient Instructions (Signed)

## 2018-03-02 ENCOUNTER — Other Ambulatory Visit: Payer: Self-pay

## 2018-03-02 DIAGNOSIS — I739 Peripheral vascular disease, unspecified: Secondary | ICD-10-CM

## 2018-03-02 DIAGNOSIS — I779 Disorder of arteries and arterioles, unspecified: Secondary | ICD-10-CM

## 2018-05-19 ENCOUNTER — Ambulatory Visit (HOSPITAL_COMMUNITY)
Admission: RE | Admit: 2018-05-19 | Discharge: 2018-05-19 | Disposition: A | Discharge status: Home / Self Care | Payer: 59 | Source: Ambulatory Visit | Attending: Family | Admitting: Family

## 2018-05-19 ENCOUNTER — Encounter: Payer: Self-pay | Admitting: Family

## 2018-05-19 ENCOUNTER — Encounter: Payer: 59 | Admitting: Family

## 2018-05-19 ENCOUNTER — Ambulatory Visit (INDEPENDENT_AMBULATORY_CARE_PROVIDER_SITE_OTHER)
Admission: RE | Admit: 2018-05-19 | Discharge: 2018-05-19 | Disposition: A | Discharge status: Home / Self Care | Payer: 59 | Source: Ambulatory Visit | Attending: Family | Admitting: Family

## 2018-05-19 ENCOUNTER — Other Ambulatory Visit: Payer: Self-pay

## 2018-05-19 VITALS — BP 132/88 | HR 106 | Temp 98.1°F | Resp 14 | Ht 70.0 in | Wt 243.0 lb

## 2018-05-19 DIAGNOSIS — I70202 Unspecified atherosclerosis of native arteries of extremities, left leg: Secondary | ICD-10-CM | POA: Insufficient documentation

## 2018-05-19 DIAGNOSIS — I779 Disorder of arteries and arterioles, unspecified: Secondary | ICD-10-CM

## 2018-05-19 DIAGNOSIS — IMO0002 Reserved for concepts with insufficient information to code with codable children: Secondary | ICD-10-CM

## 2018-05-19 DIAGNOSIS — Z87891 Personal history of nicotine dependence: Secondary | ICD-10-CM

## 2018-05-19 DIAGNOSIS — E1151 Type 2 diabetes mellitus with diabetic peripheral angiopathy without gangrene: Secondary | ICD-10-CM | POA: Insufficient documentation

## 2018-05-19 DIAGNOSIS — I10 Essential (primary) hypertension: Secondary | ICD-10-CM | POA: Insufficient documentation

## 2018-05-19 DIAGNOSIS — E1165 Type 2 diabetes mellitus with hyperglycemia: Secondary | ICD-10-CM

## 2018-05-19 DIAGNOSIS — I739 Peripheral vascular disease, unspecified: Secondary | ICD-10-CM | POA: Diagnosis not present

## 2018-05-19 NOTE — Progress Notes (Signed)
  Patient left at 2:35 before I could see her; her appointment with me was for 2:45.   Pt left a message with a staff member for me to call her with results.   I called pt cell phone number at 4:30, no answer, I left a message that her results are stable compared to the last visit, and that she needs to follow up in 3 months with the same testing.   Pt is s/p right superficial femoral artery angioplasty with drug-coated balloon (6 x 125) followed by stenting (7 x 100, 7 x 60 Inova) on 01-30-18 by Dr. Oneida Alar for nonhealing wound right foot. 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 Sharol Given.   She quit smoking in 1978. She has uncontrolled DM, last A1C result on file was 9.5 on 01-28-18.      Bilateral LE Arterial Duplex (05-19-18):  Right: Patent stent with no evidence of of stenosis within the stent, mild inflow disease with velocity of 188 cm/s proximal to stent. All biphasic waveforms. Left: 331 cm/s at native distal SFA. Monophasic waveforms at distal SFA, proximal popliteal, and distal PTA. All biphasic waveforms otherwise. Stable on the right, improved on the left compared to the exam on 02-24-18.    ABI (Date: 05/19/2018):  R:   ABI: 0.92 (was 1.03 on 02-24-18),   PT: tri  DP: tri  TBI:  0.72 (was 0.62)  L:   ABI: 0.72 (was 0.79),   PT: bi  DP: bi  TBI: 0.60 (was 0.56) Slight decline in bilateral ABI; mild disease in the right with triphasic waveforms, moderate disease in the left with biphasic waveforms.

## 2018-05-19 NOTE — Progress Notes (Signed)
Vitals:   05/19/18 1416  BP: (!) 168/97  Pulse: (!) 106  Resp: 14  Temp: 98.1 F (36.7 C)  TempSrc: Oral  SpO2: 97%  Weight: 243 lb (110.2 kg)  Height: 5\' 10"  (1.778 m)

## 2018-05-28 ENCOUNTER — Other Ambulatory Visit: Payer: Self-pay

## 2018-05-28 DIAGNOSIS — I779 Disorder of arteries and arterioles, unspecified: Secondary | ICD-10-CM

## 2018-05-28 DIAGNOSIS — I739 Peripheral vascular disease, unspecified: Secondary | ICD-10-CM

## 2018-07-09 ENCOUNTER — Encounter (HOSPITAL_COMMUNITY): Payer: 59

## 2018-07-09 ENCOUNTER — Ambulatory Visit: Payer: 59 | Admitting: Family

## 2018-08-20 ENCOUNTER — Encounter (HOSPITAL_COMMUNITY): Payer: 59

## 2018-08-20 ENCOUNTER — Ambulatory Visit: Payer: 59 | Admitting: Family

## 2018-09-10 ENCOUNTER — Ambulatory Visit (HOSPITAL_COMMUNITY)
Admission: RE | Admit: 2018-09-10 | Discharge: 2018-09-10 | Disposition: A | Discharge status: Home / Self Care | Payer: 59 | Source: Ambulatory Visit | Attending: Vascular Surgery | Admitting: Vascular Surgery

## 2018-09-10 ENCOUNTER — Ambulatory Visit (INDEPENDENT_AMBULATORY_CARE_PROVIDER_SITE_OTHER): Payer: 59 | Admitting: Family

## 2018-09-10 ENCOUNTER — Encounter: Payer: Self-pay | Admitting: Family

## 2018-09-10 ENCOUNTER — Ambulatory Visit (INDEPENDENT_AMBULATORY_CARE_PROVIDER_SITE_OTHER)
Admission: RE | Admit: 2018-09-10 | Discharge: 2018-09-10 | Disposition: A | Discharge status: Home / Self Care | Payer: 59 | Source: Ambulatory Visit | Attending: Vascular Surgery | Admitting: Vascular Surgery

## 2018-09-10 VITALS — BP 133/85 | HR 105 | Temp 97.6°F | Resp 16 | Ht 70.0 in | Wt 244.1 lb

## 2018-09-10 DIAGNOSIS — I739 Peripheral vascular disease, unspecified: Secondary | ICD-10-CM | POA: Insufficient documentation

## 2018-09-10 DIAGNOSIS — I779 Disorder of arteries and arterioles, unspecified: Secondary | ICD-10-CM | POA: Diagnosis not present

## 2018-09-10 DIAGNOSIS — E1151 Type 2 diabetes mellitus with diabetic peripheral angiopathy without gangrene: Secondary | ICD-10-CM | POA: Diagnosis not present

## 2018-09-10 DIAGNOSIS — Z87891 Personal history of nicotine dependence: Secondary | ICD-10-CM

## 2018-09-10 DIAGNOSIS — IMO0002 Reserved for concepts with insufficient information to code with codable children: Secondary | ICD-10-CM

## 2018-09-10 DIAGNOSIS — Z95820 Peripheral vascular angioplasty status with implants and grafts: Secondary | ICD-10-CM

## 2018-09-10 DIAGNOSIS — E1165 Type 2 diabetes mellitus with hyperglycemia: Secondary | ICD-10-CM

## 2018-09-10 NOTE — Progress Notes (Signed)
[facility] [place]   CC: Follow up peripheral artery occlusive disease  History of Present Illness Melanie Atkins is a 67 y.o. female who is s/p right superficial femoral artery angioplasty with drug-coated balloon (6 x 125) followed by stenting (7 x 100, 7 x 60 Inova) on 01-30-18 by Dr. Oneida Alar for nonhealing wound right foot. 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 Sharol Given.   She reports she can walk as far as she wants with no problems in her legs.   She denies any hx of stroke or TIA.    The patient's right 2nd toe amputation site is well healed.   Diabetic: Yes, uncontrolled DM, last A1C result on file was 9.5 on 01-28-18.    Tobacco use: former smoker, quit in 1978, smoked x 4 years  Pt meds include: Statin :Yes Betablocker: No ASA: No Other anticoagulants/antiplatelets: no  Past Medical History:  Diagnosis Date  . 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 High cholesterol   . History of blood transfusion ~ 2002   "related to hairy cell leukemia"  . Hypertension   . Type II diabetes mellitus ([place])     Social History Social History   Tobacco Use  . Smoking status: Former Smoker    Packs/day: 0.50    Years: 4.00    Pack years: 2.00    Types: Cigarettes    Last attempt to quit: 1978    Years since quitting: 41.8  . Smokeless tobacco: Never Used  Substance Use Topics  . Alcohol use: Yes    Alcohol/week: 4.0 standard drinks    Types: 4 Glasses of wine per week  . Drug use: No    Family History History reviewed. No pertinent family history.  Past Surgical History:  Procedure Laterality Date  . ABDOMINAL AORTOGRAM W/LOWER EXTREMITY Right 01/30/2018   Procedure: ABDOMINAL AORTOGRAM W/LOWER EXTREMITY;  Surgeon: Elam Dutch, MD;  Location: [facility];  Service: Cardiovascular;  Laterality: Right;  . ABDOMINAL HYSTERECTOMY    . AMPUTATION Right 01/30/2018   Procedure: RIGHT FOOT 2ND RAY AMPUTATION;  Surgeon:  [name], MD;  Location: [facility];  Service: Orthopedics;  Laterality: Right;  . CESAREAN SECTION  1994  . PERIPHERAL VASCULAR INTERVENTION Right 01/30/2018   Procedure: PERIPHERAL VASCULAR INTERVENTION;  Surgeon: Elam Dutch, MD;  Location: [facility];  Service: Cardiovascular;  Laterality: Right;  . TONSILLECTOMY      No Known Allergies  Current Outpatient Medications  Medication Sig Dispense Refill  . atorvastatin (LIPITOR) 80 MG tablet Take 80 mg by mouth daily.  0  . benazepril-hydrochlorthiazide (LOTENSIN HCT) 20-25 MG per tablet Take 1 tablet by mouth daily.     . Dulaglutide (TRULICITY) 6.23 JS/2.8BT SOPN Trulicity 5.17 OH/6.0 mL subcutaneous pen injector  0.75 MG ONCE A WEEK SUBCUTANEOUS 30 DAYS    . GLUCOS-CHONDROIT-CA-MG-C-D PO Take 1 tablet by mouth every morning.     [name] LEVEMIR FLEXTOUCH 100 UNIT/ML Pen Inject 40 Units into the skin daily at 10 pm.     . metFORMIN (GLUCOPHAGE-XR) 500 MG 24 hr tablet Take 2,000 mg by mouth daily with supper.    . Omega-3 Fatty Acids (FISH OIL) 1000 MG CAPS Take 1,000 mg by mouth every morning.      No current facility-administered medications for this visit.     ROS: See HPI for pertinent positives and negatives.   Physical Examination  Vitals:  09/10/18 1508  BP: 133/85  Pulse: (!) 105  Resp: 16  Temp: 97.6 F (36.4 C)  TempSrc: Oral  SpO2: 97%  Weight: 244 lb 1.6 oz (110.7 kg)  Height: 5\' 10"  (1.778 m)   Body mass index is 35.02 kg/m.  General: A&O x 3, WDWN, obese female. Gait: normal HENT: No gross abnormalities.  Eyes: PERRLA. Pulmonary: Respirations are non labored, CTAB, good air movement in all fields Cardiac: regular rhythm, no detected murmur.         Carotid Bruits Right Left   Negative Negative   Radial pulses are 2+ palpable bilaterally   Adominal aortic pulse is not palpable                         VASCULAR EXAM: Extremities without ischemic changes, without Gangrene; without open  wounds. Right second toe is surgically absent.                                                                                                           LE Pulses Right Left       FEMORAL  not palpable (obese)  not palpable        POPLITEAL  not palpable   not palpable       POSTERIOR TIBIAL  not palpable   not palpable        DORSALIS PEDIS      ANTERIOR TIBIAL 2+ palpable  1+ palpable    Abdomen: soft, NT, no palpable masses. Skin: no rashes, no cellulitis, no ulcers noted. Musculoskeletal: no muscle wasting or atrophy.  Neurologic: A&O X 3; appropriate affect, Sensation is normal; MOTOR FUNCTION:  moving all extremities equally, motor strength 5/5 throughout. Speech is fluent/normal. CN 2-12 intact. Psychiatric: Thought content is normal, mood appropriate for clinical situation.     ASSESSMENT: Melanie Atkins is a 67 y.o. female who is s/p right superficial femoral artery angioplasty with drug-coated balloon (6 x 125) followed by stenting (7 x 100, 7 x 60 Inova) on 01-30-18 by Dr. Oneida Alar for nonhealing wound right foot. 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 Sharol Given; this has healed.   She reports she can walk as far as she wants with no problems in her legs.    She quit smoking in 1978. She has uncontrolled DM, last A1C result on file was 9.5 on 01-28-18, which is her primary atherosclerotic risk factor.   She has not been taking an 81 mg daily ASA, she is not allergic to ASA, denies any bleeding problems. I advised her to start taking a daily 81 mg OTC ASA to reduce her risk of having a cardiovascular event.    DATA  Right LE Arterial Duplex (09-10-18): No significant stenosis of the right LE, tri and biphasic waveforms. Less stenosis compared to the exam on 05-19-18.   ABI (Date: 09/10/2018):  R:   ABI: 0.90 (was 0.92 on 05-19-18),   PT: bi  DP: bi  TBI:  0.55, toe pressure 85, (  was 0.72)  L:   ABI: 0.64 (was 0.72),   PT: bi  DP:  bi  TBI: 0.49, toe pressure 76, (was 0.60) Stable ABI on the right with mild disease, decline in left ABI with moderate disease, biphasic waveforms bilaterally. Decline in bilateral TBI.   PLAN:  Walk at least 30 minutes daily.   Based on the patient's vascular studies and examination, pt will return to clinic in based on non duplex results, pt will return to the clinic in 3 months with ABI's and bilateral LE arterial duplex. I advised her to notify us if she develops concerns re the circulation in her feet or legs.   I discussed in depth with the patient the nature of atherosclerosis, and emphasized the importance of maximal medical management including strict control of blood pressure, blood glucose, and lipid levels, obtaining regular exercise, and continued cessation of smoking.  The patient is aware that without maximal medical management the underlying atherosclerotic disease process will progress, limiting the benefit of any interventions.  The patient was given information about PAD including signs, symptoms, treatment, what symptoms should prompt the patient to seek immediate medical care, and risk reduction measures to take.  Clemon Chambers, RN, MSN, FNP-C [facility] Phone: [phone]  Clinic MD: [facility]  09/10/18 4:44 PM

## 2018-09-10 NOTE — Patient Instructions (Signed)

## 2018-12-10 ENCOUNTER — Other Ambulatory Visit: Payer: Self-pay

## 2018-12-10 DIAGNOSIS — I779 Disorder of arteries and arterioles, unspecified: Secondary | ICD-10-CM

## 2018-12-10 DIAGNOSIS — I739 Peripheral vascular disease, unspecified: Secondary | ICD-10-CM

## 2018-12-10 NOTE — Progress Notes (Deleted)
HISTORY AND PHYSICAL     CC:  follow up. Requesting Provider:  Aretta Nip, MD  HPI: This is a 68 y.o. female who is here today for follow up.  She is s/p right SFA angioplasty with drug coated balloon (9B353) followed by stenting (7x100, 7x60 Inova) on 01/30/18 by Dr. Oneida Alar.  She also underwent ray amputation of the right 2nd toe on 01/30/18 by Dr. Sharol Given.  At her last visit in October 2019, she was walking without any difficulties.    The pt returns today for ***  The pt is on a statin for cholesterol management.    The pt does have diabetes. The pt is on an ACEI for hypertension.  The pt is not on an aspirin.   Past Medical History:  Diagnosis Date  . 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 High cholesterol   . History of blood transfusion ~ 2002   "related to hairy cell leukemia"  . Hypertension   . Type II diabetes mellitus ([place])     Past Surgical History:  Procedure Laterality Date  . ABDOMINAL AORTOGRAM W/LOWER EXTREMITY Right 01/30/2018   Procedure: ABDOMINAL AORTOGRAM W/LOWER EXTREMITY;  Surgeon: Elam Dutch, MD;  Location: [facility];  Service: Cardiovascular;  Laterality: Right;  . ABDOMINAL HYSTERECTOMY    . AMPUTATION Right 01/30/2018   Procedure: RIGHT FOOT 2ND RAY AMPUTATION;  Surgeon: [name], MD;  Location: [facility];  Service: Orthopedics;  Laterality: Right;  . CESAREAN SECTION  1994  . PERIPHERAL VASCULAR INTERVENTION Right 01/30/2018   Procedure: PERIPHERAL VASCULAR INTERVENTION;  Surgeon: Elam Dutch, MD;  Location: [facility];  Service: Cardiovascular;  Laterality: Right;  . TONSILLECTOMY      No Known Allergies  Current Outpatient Medications  Medication Sig Dispense Refill  . atorvastatin (LIPITOR) 80 MG tablet Take 80 mg by mouth daily.  0  . benazepril-hydrochlorthiazide (LOTENSIN HCT) 20-25 MG per tablet Take 1 tablet by mouth daily.     . Dulaglutide (TRULICITY) 2.99 ME/2.6ST SOPN Trulicity 4.19 QQ/2.2 mL  subcutaneous pen injector  0.75 MG ONCE A WEEK SUBCUTANEOUS 30 DAYS    . GLUCOS-CHONDROIT-CA-MG-C-D PO Take 1 tablet by mouth every morning.     [name] LEVEMIR FLEXTOUCH 100 UNIT/ML Pen Inject 40 Units into the skin daily at 10 pm.     . metFORMIN (GLUCOPHAGE-XR) 500 MG 24 hr tablet Take 2,000 mg by mouth daily with supper.    . Omega-3 Fatty Acids (FISH OIL) 1000 MG CAPS Take 1,000 mg by mouth every morning.      No current facility-administered medications for this visit.     No family history on file.  Social History   Socioeconomic History  . Marital status: Married    Spouse name: Not on file  . Number of children: Not on file  . Years of education: Not on file  . Highest education level: Not on file  Occupational History  . Not on file  Social Needs  . Financial resource strain: Not on file  . Food insecurity:    Worry: Not on file    Inability: Not on file  . Transportation needs:    Medical: Not on file    Non-medical: Not on file  Tobacco Use  . Smoking status: Former Smoker    Packs/day: 0.50    Years: 4.00    Pack years: 2.00    Types: Cigarettes    Last attempt to quit: 1978  Years since quitting: 42.0  . Smokeless tobacco: Never Used  Substance and Sexual Activity  . Alcohol use: Yes    Alcohol/week: 4.0 standard drinks    Types: 4 Glasses of wine per week  . Drug use: No  . Sexual activity: Not Currently  Lifestyle  . Physical activity:    Days per week: Not on file    Minutes per session: Not on file  . Stress: Not on file  Relationships  . Social connections:    Talks on phone: Not on file    Gets together: Not on file    Attends religious service: Not on file    Active member of club or organization: Not on file    Attends meetings of clubs or organizations: Not on file    Relationship status: Not on file  . Intimate partner violence:    Fear of current or ex partner: Not on file    Emotionally abused: Not on file    Physically abused: Not on  file    Forced sexual activity: Not on file  Other Topics Concern  . Not on file  Social History Narrative  . Not on file     REVIEW OF SYSTEMS:  *** [X]  denotes positive finding, [ ]  denotes negative finding Cardiac  Comments:  Chest pain or chest pressure:    Shortness of breath upon exertion:    Short of breath when lying flat:    Irregular heart rhythm:        Vascular    Pain in calf, thigh, or hip brought on by ambulation:    Pain in feet at night that wakes you up from your sleep:     Blood clot in your veins:    Leg swelling:         Pulmonary    Oxygen at home:    Productive cough:     Wheezing:         Neurologic    Sudden weakness in arms or legs:     Sudden numbness in arms or legs:     Sudden onset of difficulty speaking or slurred speech:    Temporary loss of vision in one eye:     Problems with dizziness:         Gastrointestinal    Blood in stool:     Vomited blood:         Genitourinary    Burning when urinating:     Blood in urine:        Psychiatric    Major depression:         Hematologic    Bleeding problems:    Problems with blood clotting too easily:        Skin    Rashes or ulcers:        Constitutional    Fever or chills:      PHYSICAL EXAMINATION:  ***  General:  WDWN in NAD; vital signs documented above Gait: Not observed HENT: WNL, normocephalic Pulmonary: normal non-labored breathing , without Rales, rhonchi,  wheezing Cardiac: {Desc; regular/irreg:14544} HR, without  Murmurs; {With/Without:20273} carotid bruit*** Abdomen: soft, NT, no masses Skin: {With/Without:20273} rashes Vascular Exam/Pulses:  Right Left  Radial {Exam; arterial pulse strength 0-4:30167} {Exam; arterial pulse strength 0-4:30167}  Ulnar {Exam; arterial pulse strength 0-4:30167} {Exam; arterial pulse strength 0-4:30167}  Femoral {Exam; arterial pulse strength 0-4:30167} {Exam; arterial pulse strength 0-4:30167}  Popliteal {Exam; arterial pulse  strength 0-4:30167} {Exam; arterial pulse strength 0-4:30167}  DP {Exam; arterial pulse strength 0-4:30167} {Exam; arterial pulse strength 0-4:30167}  PT {Exam; arterial pulse strength 0-4:30167} {Exam; arterial pulse strength 0-4:30167}   Extremities: {With/Without:20273} ischemic changes, {With/Without:20273} Gangrene , {With/Without:20273} cellulitis; {With/Without:20273} open wounds;  Musculoskeletal: no muscle wasting or atrophy  Neurologic: A&O X 3;  No focal weakness or paresthesias are detected Psychiatric:  The pt has {Desc; normal/abnormal:11317::"Normal"} affect.   Non-Invasive Vascular Imaging:   ABI's on 12/11/2018: Right:  *** Left:  ***  RLE Arterial duplex on 09/10/18: Right LE Arterial Duplex (09-10-18): No significant stenosis of the right LE, tri and biphasic waveforms. Less stenosis compared to the exam on 05-19-18.   Previous ABI's/TBI's on 09/10/18: Right:  0.90/0.55  PT (B)  DP (B) Left:  0.64/0.49  PT (B)  DP (B) Stable ABI on the right with mild disease, decline in left ABI with moderate disease, biphasic waveforms bilaterally. Decline in bilateral TBI.  Pt meds includes: Statin:  Yes.   Beta Blocker:  No. Aspirin:  No. ACEI:  Yes.   ARB:  No. CCB use:  No Other Antiplatelet/Anticoagulant:  No   ASSESSMENT/PLAN:: 68 y.o. female here for follow up for right SFA angioplasty with drug coated balloon (3H438) followed by stenting (7x100, 7x60 Inova) on 01/30/18 by Dr. Oneida Alar    -***   Leontine Locket, PA-C [facility] [phone]  Clinic MD:   Donzetta Matters

## 2018-12-11 ENCOUNTER — Encounter (HOSPITAL_COMMUNITY): Payer: 59

## 2018-12-11 ENCOUNTER — Ambulatory Visit: Payer: 59 | Admitting: Family

## 2018-12-17 ENCOUNTER — Encounter (HOSPITAL_COMMUNITY): Payer: 59

## 2018-12-18 ENCOUNTER — Ambulatory Visit: Payer: 59 | Admitting: Family

## 2019-01-05 ENCOUNTER — Ambulatory Visit (INDEPENDENT_AMBULATORY_CARE_PROVIDER_SITE_OTHER)
Admission: RE | Admit: 2019-01-05 | Discharge: 2019-01-05 | Disposition: A | Discharge status: Home / Self Care | Payer: 59 | Source: Ambulatory Visit | Attending: Vascular Surgery | Admitting: Vascular Surgery

## 2019-01-05 ENCOUNTER — Ambulatory Visit (HOSPITAL_COMMUNITY)
Admission: RE | Admit: 2019-01-05 | Discharge: 2019-01-05 | Disposition: A | Discharge status: Home / Self Care | Payer: 59 | Source: Ambulatory Visit | Attending: Vascular Surgery | Admitting: Vascular Surgery

## 2019-01-05 DIAGNOSIS — I739 Peripheral vascular disease, unspecified: Secondary | ICD-10-CM

## 2019-01-05 DIAGNOSIS — I779 Disorder of arteries and arterioles, unspecified: Secondary | ICD-10-CM

## 2019-01-05 NOTE — Progress Notes (Signed)
HISTORY AND PHYSICAL     CC:  follow up. Requesting Provider:  Aretta Nip, MD  HPI: This is a 68 y.o. female who is here today for follow up.  She has a hx of right SFA angioplasty with drug coated balloon (6 x 125) followed by stenting (7 x 100, 7 x 60 Inova) on 01/30/18 by Dr. Oneida Alar for non healing wound on the right foot.  She had a s/p ray amputation right 2nd toe on 01/30/18 by Dr. Sharol Given.   At her last visit, she was walking without difficulty.    The pt returns today for follow up.  She states that she is doing well.  She doesn't not have cramping in her calves when she walks, she does not have non healing wounds on her feet.  She does have a blood blister on medial aspect of her foot at the right great toe.  She states that she feels this happened after she walked through the [place] a couple days ago.  She states that on occasion, she does get some swelling in the right ankle.  It does get better after she wakes up.  She will sometimes wear compression socks to bed.  She does not wear them during the day as she says they are ugly.   The pt is on a statin for cholesterol management.    The pt does have diabetes. The pt is on ACEI for hypertension.  The pt is not on an aspirin.  Tobacco hx:  Remote-quit 1978 Other AC:  none  Past Medical History:  Diagnosis Date  . 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 High cholesterol   . History of blood transfusion ~ 2002   "related to hairy cell leukemia"  . Hypertension   . Type II diabetes mellitus ([place])     Past Surgical History:  Procedure Laterality Date  . ABDOMINAL AORTOGRAM W/LOWER EXTREMITY Right 01/30/2018   Procedure: ABDOMINAL AORTOGRAM W/LOWER EXTREMITY;  Surgeon: Elam Dutch, MD;  Location: [facility];  Service: Cardiovascular;  Laterality: Right;  . ABDOMINAL HYSTERECTOMY    . AMPUTATION Right 01/30/2018   Procedure: RIGHT FOOT 2ND RAY AMPUTATION;  Surgeon: [name], MD;  Location: [facility];   Service: Orthopedics;  Laterality: Right;  . CESAREAN SECTION  1994  . PERIPHERAL VASCULAR INTERVENTION Right 01/30/2018   Procedure: PERIPHERAL VASCULAR INTERVENTION;  Surgeon: Elam Dutch, MD;  Location: [facility];  Service: Cardiovascular;  Laterality: Right;  . TONSILLECTOMY      No Known Allergies  Current Outpatient Medications  Medication Sig Dispense Refill  . atorvastatin (LIPITOR) 80 MG tablet Take 80 mg by mouth daily.  0  . benazepril-hydrochlorthiazide (LOTENSIN HCT) 20-25 MG per tablet Take 1 tablet by mouth daily.     . Dulaglutide (TRULICITY) 0.93 GH/8.2XH SOPN Trulicity 3.71 IR/6.7 mL subcutaneous pen injector  0.75 MG ONCE A WEEK SUBCUTANEOUS 30 DAYS    . GLUCOS-CHONDROIT-CA-MG-C-D PO Take 1 tablet by mouth every morning.     [name] LEVEMIR FLEXTOUCH 100 UNIT/ML Pen Inject 40 Units into the skin daily at 10 pm.     . metFORMIN (GLUCOPHAGE-XR) 500 MG 24 hr tablet Take 2,000 mg by mouth daily with supper.    . Omega-3 Fatty Acids (FISH OIL) 1000 MG CAPS Take 1,000 mg by mouth every morning.      No current facility-administered medications for this visit.     No family history on file.  Social History   Socioeconomic History  . Marital status: Married    Spouse name: Not on file  . Number of children: Not on file  . Years of education: Not on file  . Highest education level: Not on file  Occupational History  . Not on file  Social Needs  . Financial resource strain: Not on file  . Food insecurity:    Worry: Not on file    Inability: Not on file  . Transportation needs:    Medical: Not on file    Non-medical: Not on file  Tobacco Use  . Smoking status: Former Smoker    Packs/day: 0.50    Years: 4.00    Pack years: 2.00    Types: Cigarettes    Last attempt to quit: 1978    Years since quitting: 42.1  . Smokeless tobacco: Never Used  Substance and Sexual Activity  . Alcohol use: Yes    Alcohol/week: 4.0 standard drinks    Types: 4 Glasses  of wine per week  . Drug use: No  . Sexual activity: Not Currently  Lifestyle  . Physical activity:    Days per week: Not on file    Minutes per session: Not on file  . Stress: Not on file  Relationships  . Social connections:    Talks on phone: Not on file    Gets together: Not on file    Attends religious service: Not on file    Active member of club or organization: Not on file    Attends meetings of clubs or organizations: Not on file    Relationship status: Not on file  . Intimate partner violence:    Fear of current or ex partner: Not on file    Emotionally abused: Not on file    Physically abused: Not on file    Forced sexual activity: Not on file  Other Topics Concern  . Not on file  Social History Narrative  . Not on file     REVIEW OF SYSTEMS:   [X]  denotes positive finding, [ ]  denotes negative finding Cardiac  Comments:  Chest pain or chest pressure:    Shortness of breath upon exertion:    Short of breath when lying flat:    Irregular heart rhythm:        Vascular    Pain in calf, thigh, or hip brought on by ambulation:    Pain in feet at night that wakes you up from your sleep:     Blood clot in your veins:    Leg swelling:         Pulmonary    Oxygen at home:    Productive cough:     Wheezing:         Neurologic    Sudden weakness in arms or legs:     Sudden numbness in arms or legs:     Sudden onset of difficulty speaking or slurred speech:    Temporary loss of vision in one eye:     Problems with dizziness:         Gastrointestinal    Blood in stool:     Vomited blood:         Genitourinary    Burning when urinating:     Blood in urine:        Psychiatric    Major depression:         Hematologic    Bleeding problems:    Problems with blood  clotting too easily:        Skin    Rashes or ulcers:        Constitutional    Fever or chills:      PHYSICAL EXAMINATION:  Today's Vitals   01/06/19 0909  BP: (!) 168/92  Pulse: 97    Resp: 14  Temp: 98.1 F (36.7 C)  TempSrc: Oral  SpO2: 95%  Weight: 245 lb 11.2 oz (111.4 kg)  Height: 5\' 10"  (1.778 m)   Body mass index is 35.25 kg/m.   General:  WDWN in NAD; vital signs documented above Gait: Not observed HENT: WNL, normocephalic Pulmonary: normal non-labored breathing , without Rales, rhonchi,  wheezing Cardiac: regular HR, without  Murmurs; without carotid bruits Abdomen: soft, NT, no masses Skin: without rashes Vascular Exam/Pulses:  Right Left  Radial 2+ (normal) 2+ (normal)  Femoral 2+ (normal) 2+ (normal)  Popliteal Unable to palpate Unable to palpate   DP Unable to palpate; Unable to palpate   PT Unable to palpate  Unable to palpate    Extremities: without ischemic changes, without Gangrene , without cellulitis; without open wounds;  Musculoskeletal: no muscle wasting or atrophy  Neurologic: A&O X 3;  No focal weakness or paresthesias are detected Psychiatric:  The pt has Normal affect.   Non-Invasive Vascular Imaging:   ABI's/TBI's on 01/06/2019: Right:  0.86/0.57 Left:  0.63/0.56  Arterial duplex on 01/06/2019: Right: Patent stent with no evidence of stenosis in the right femoral artery.  Previous ABI's/TBI's on 09/10/18: Right:  0.90/0.55 Left:  0.64/0.49  Arterial duplex 09/10/18: Right: Patent Right SFA stent with velocities within the outflow suggestive of a 30-49% stenosis.   ASSESSMENT/PLAN:: 68 y.o. female here for follow up for hx of right SFA angioplasty with drug coated balloon (6 x 125) followed by stenting (7 x 100, 7 x 60 Inova) on 01/30/18 by Dr. Oneida Alar for non healing wound on the right foot.  She had a s/p ray amputation right 2nd toe on 01/30/18 by Dr. Sharol Given.    -pt's ABI's are essentially unchanged from previous visit and right SFA stent is patent.  She is not having difficulty walking or having claudication.   -will see her back in 6 months.  If tests still unchanged may change to every year.  She will call us  sooner should she develop any non healing wounds or rest pain. -continue statin.   Leontine Locket, PA-C [facility] [phone]  Clinic MD:   Oneida Alar

## 2019-01-06 ENCOUNTER — Other Ambulatory Visit: Payer: Self-pay

## 2019-01-06 ENCOUNTER — Ambulatory Visit (INDEPENDENT_AMBULATORY_CARE_PROVIDER_SITE_OTHER): Payer: 59 | Admitting: Physician Assistant

## 2019-01-06 VITALS — BP 168/92 | HR 97 | Temp 98.1°F | Resp 14 | Ht 70.0 in | Wt 245.7 lb

## 2019-01-06 DIAGNOSIS — I779 Disorder of arteries and arterioles, unspecified: Secondary | ICD-10-CM | POA: Diagnosis not present

## 2019-06-29 ENCOUNTER — Other Ambulatory Visit: Payer: Self-pay

## 2019-06-29 DIAGNOSIS — I779 Disorder of arteries and arterioles, unspecified: Secondary | ICD-10-CM

## 2019-07-05 ENCOUNTER — Ambulatory Visit: Payer: 59 | Admitting: Family

## 2019-07-05 ENCOUNTER — Other Ambulatory Visit: Payer: Self-pay

## 2019-07-05 ENCOUNTER — Ambulatory Visit (INDEPENDENT_AMBULATORY_CARE_PROVIDER_SITE_OTHER)
Admission: RE | Admit: 2019-07-05 | Discharge: 2019-07-05 | Disposition: A | Discharge status: Home / Self Care | Payer: 59 | Source: Ambulatory Visit | Attending: Family | Admitting: Family

## 2019-07-05 ENCOUNTER — Ambulatory Visit (HOSPITAL_COMMUNITY)
Admission: RE | Admit: 2019-07-05 | Discharge: 2019-07-05 | Disposition: A | Discharge status: Home / Self Care | Payer: 59 | Source: Ambulatory Visit | Attending: Family | Admitting: Family

## 2019-07-05 DIAGNOSIS — I779 Disorder of arteries and arterioles, unspecified: Secondary | ICD-10-CM | POA: Insufficient documentation

## 2019-07-09 ENCOUNTER — Telehealth: Payer: Self-pay | Admitting: *Deleted

## 2019-07-09 NOTE — Telephone Encounter (Signed)
Virtual Visit Pre-Appointment Phone Call  Today, I spoke with Melanie Atkins and performed the following actions:  1. I explained that we are currently trying to limit exposure to the COVID-19 virus by seeing patients at home rather than in the office.  I explained that the visits are best done by video, but can be done by telephone.  I asked the patient if a virtual visit that the patient would like to try instead of coming into the office. Melanie Atkins agreed to proceed with the virtual visit scheduled with Melanie Level Nickel NP on 07/12/19 .     2. I confirmed the BEST phone number to call the day of the visit and- I included this in appointment notes.  3. I asked if the patient had access to (through a family member/friend) a smartphone with video capability to be used for her visit?"  The patient said yes -    4. I confirmed consent by  a. sending through [name] or by email the [place] as written at the end of this message or  b. verbally as listed below. i. This visit is being performed in the setting of COVID-19. ii. All virtual visits are billed to your insurance company just like a normal visit would be.   iii. We'd like you to understand that the technology does not allow for your provider to perform an examination, and thus may limit your provider's ability to fully assess your condition.  iv. If your provider identifies any concerns that need to be evaluated in person, we will make arrangements to do so.   v. Finally, though the technology is pretty good, we cannot assure that it will always work on either your or our end, and in the setting of a video visit, we may have to convert it to a phone-only visit.  In either situation, we cannot ensure that we have a secure connection.   vi. Are you willing to proceed?"  STAFF: Did the patient verbally acknowledge consent to telehealth visit? Document YES/NO here: YES  2. I advised the patient  to be prepared - I asked that the patient, on the day of her visit, record any information possible with the equipment at her home, such as blood pressure, pulse, oxygen saturation, and your weight and write them all down. I asked the patient to have a pen and paper handy nearby the day of the visit as well.  3. If the patient was scheduled for a video visit, I informed the patient that the visit with the doctor would start with a text to the smartphone # given to [place] by the patient.         If the patient was scheduled for a telephone call, I informed the patient that the visit with the doctor would start with a call to the telephone # given to [place] by the patient.  4. I Informed patient they will receive a phone call 15 minutes prior to their appointment time from a [place] or nurse to review medications, allergies, etc. to prepare for the visit.    TELEPHONE CALL NOTE  Melanie Atkins has been deemed a candidate for a follow-up tele-health visit to limit community exposure during the Covid-19 pandemic. I spoke with the patient via phone to ensure availability of phone/video source, confirm preferred email & phone number, and discuss instructions and expectations.  I reminded Melanie Atkins to be prepared with any vital sign and/or heart rhythm  information that could potentially be obtained via home monitoring, at the time of her visit. I reminded Melanie Atkins to expect a phone call prior to her visit.  Melanie Atkins, NT 07/09/2019 4:21 PM     FULL LENGTH CONSENT FOR TELE-HEALTH VISIT   I hereby voluntarily request, consent and authorize [facility] and its employed or contracted physicians, physician assistants, nurse practitioners or other licensed health care professionals (the Practitioner), to provide me with telemedicine health care services (the "Services") as deemed necessary by the treating Practitioner. I acknowledge and consent to receive the Services by the Practitioner via  telemedicine. I understand that the telemedicine visit will involve communicating with the Practitioner through live audiovisual communication technology and the disclosure of certain medical information by electronic transmission. I acknowledge that I have been given the opportunity to request an in-person assessment or other available alternative prior to the telemedicine visit and am voluntarily participating in the telemedicine visit.  I understand that I have the right to withhold or withdraw my consent to the use of telemedicine in the course of my care at any time, without affecting my right to future care or treatment, and that the Practitioner or I may terminate the telemedicine visit at any time. I understand that I have the right to inspect all information obtained and/or recorded in the course of the telemedicine visit and may receive copies of available information for a reasonable fee.  I understand that some of the potential risks of receiving the Services via telemedicine include:  Melanie Atkins Delay or interruption in medical evaluation due to technological equipment failure or disruption; . Information transmitted may not be sufficient (e.g. poor resolution of images) to allow for appropriate medical decision making by the Practitioner; and/or  . In rare instances, security protocols could fail, causing a breach of personal health information.  Furthermore, I acknowledge that it is my responsibility to provide information about my medical history, conditions and care that is complete and accurate to the best of my ability. I acknowledge that Practitioner's advice, recommendations, and/or decision may be based on factors not within their control, such as incomplete or inaccurate data provided by me or distortions of diagnostic images or specimens that may result from electronic transmissions. I understand that the practice of medicine is not an exact science and that Practitioner makes no warranties or  guarantees regarding treatment outcomes. I acknowledge that I will receive a copy of this consent concurrently upon execution via email to the email address I last provided but may also request a printed copy by calling the office of [facility].    I understand that my insurance will be billed for this visit.   I have read or had this consent read to me. . I understand the contents of this consent, which adequately explains the benefits and risks of the Services being provided via telemedicine.  . I have been provided ample opportunity to ask questions regarding this consent and the Services and have had my questions answered to my satisfaction. . I give my informed consent for the services to be provided through the use of telemedicine in my medical care  By participating in this telemedicine visit I agree to the above.

## 2019-07-12 ENCOUNTER — Ambulatory Visit (INDEPENDENT_AMBULATORY_CARE_PROVIDER_SITE_OTHER): Payer: 59 | Admitting: Family

## 2019-07-12 ENCOUNTER — Encounter: Payer: Self-pay | Admitting: Family

## 2019-07-12 ENCOUNTER — Other Ambulatory Visit: Payer: Self-pay

## 2019-07-12 VITALS — Ht 70.0 in | Wt 230.0 lb

## 2019-07-12 DIAGNOSIS — I779 Disorder of arteries and arterioles, unspecified: Secondary | ICD-10-CM

## 2019-07-12 DIAGNOSIS — Z95820 Peripheral vascular angioplasty status with implants and grafts: Secondary | ICD-10-CM

## 2019-07-12 DIAGNOSIS — Z87891 Personal history of nicotine dependence: Secondary | ICD-10-CM | POA: Diagnosis not present

## 2019-07-12 NOTE — Patient Instructions (Signed)
Peripheral Vascular Disease  Peripheral vascular disease (PVD) is a disease of the blood vessels that are not part of your heart and brain. A simple term for PVD is poor circulation. In most cases, PVD narrows the blood vessels that carry blood from your heart to the rest of your body. This can reduce the supply of blood to your arms, legs, and internal organs, like your stomach or kidneys. However, PVD most often affects a person's lower legs and feet. Without treatment, PVD tends to get worse. PVD can also lead to acute ischemic limb. This is when an arm or leg suddenly cannot get enough blood. This is a medical emergency. Follow these instructions at home: Lifestyle  Do not use any products that contain nicotine or tobacco, such as cigarettes and e-cigarettes. If you need help quitting, ask your doctor.  Lose weight if you are overweight. Or, stay at a healthy weight as told by your doctor.  Eat a diet that is low in fat and cholesterol. If you need help, ask your doctor.  Exercise regularly. Ask your doctor for activities that are right for you. General instructions  Take over-the-counter and prescription medicines only as told by your doctor.  Take good care of your feet: ? Wear comfortable shoes that fit well. ? Check your feet often for any cuts or sores.  Keep all follow-up visits as told by your doctor This is important. Contact a doctor if:  You have cramps in your legs when you walk.  You have leg pain when you are at rest.  You have coldness in a leg or foot.  Your skin changes.  You are unable to get or have an erection (erectile dysfunction).  You have cuts or sores on your feet that do not heal. Get help right away if:  Your arm or leg turns cold, numb, and blue.  Your arms or legs become red, warm, swollen, painful, or numb.  You have chest pain.  You have trouble breathing.  You suddenly have weakness in your face, arm, or leg.  You become very  confused or you cannot speak.  You suddenly have a very bad headache.  You suddenly cannot see. Summary  Peripheral vascular disease (PVD) is a disease of the blood vessels.  A simple term for PVD is poor circulation. Without treatment, PVD tends to get worse.  Treatment may include exercise, low fat and low cholesterol diet, and quitting smoking. This information is not intended to replace advice given to you by your health care provider. Make sure you discuss any questions you have with your health care provider. Document Released: 01/29/2010 Document Revised: 10/17/2017 Document Reviewed: 12/12/2016 Elsevier Patient Education  2020 Else[employer] Inc.  

## 2019-07-12 NOTE — Progress Notes (Signed)
Virtual Visit via Telephone Note   I connected with Melanie Atkins on 07/12/2019 using the [email or web address] by telephone and verified that I was speaking with the correct person using two identifiers. Patient was located at her home and accompanied by herself. I am located at the [facility] office[facility]clinic.   The limitations of evaluation and management by telemedicine and the availability of in person appointments have been previously discussed with the patient and are documented in the patients chart. The patient expressed understanding and consented to proceed.  PCP: Aretta Nip, MD  Chief Complaint: Follow up peripheral artery occlusive disease   History of Present Illness: Melanie Atkins is a 68 y.o. female who is s/pright superficial femoral artery angioplasty with drug-coated balloon (6 x 125) followed by stenting (7 x 100, 7 x 60 Inova)on 01-30-18 by Dr. Oneida Alar for nonhealing wound right foot. 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 Sharol Given.   She reports she can walk as far as she wants with no problems in her legs.   She denies any hx of stroke or TIA.   The patient'sright 2nd toe amputation site is wellhealed.   Diabetic: Yes, uncontrolled DM, last A1C result on file was 9.5 on 01-28-18.   Tobacco use: former smoker, quit in 1978, smoked x 4 years  Pt meds include: Statin :Yes Betablocker: No ASA: No Other anticoagulants/antiplatelets: no   Past Medical History:  Diagnosis Date  . 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 High cholesterol   . History of blood transfusion ~ 2002   "related to hairy cell leukemia"  . Hypertension   . Type II diabetes mellitus ([place])     Past Surgical History:  Procedure Laterality Date  . ABDOMINAL AORTOGRAM W/LOWER EXTREMITY Right 01/30/2018   Procedure: ABDOMINAL AORTOGRAM W/LOWER EXTREMITY;  Surgeon: Elam Dutch, MD;  Location: [facility];  Service: Cardiovascular;  Laterality: Right;  .  ABDOMINAL HYSTERECTOMY    . AMPUTATION Right 01/30/2018   Procedure: RIGHT FOOT 2ND RAY AMPUTATION;  Surgeon: [name], MD;  Location: [facility];  Service: Orthopedics;  Laterality: Right;  . CESAREAN SECTION  1994  . PERIPHERAL VASCULAR INTERVENTION Right 01/30/2018   Procedure: PERIPHERAL VASCULAR INTERVENTION;  Surgeon: Elam Dutch, MD;  Location: [facility];  Service: Cardiovascular;  Laterality: Right;  . TONSILLECTOMY      Current Meds  Medication Sig  . atorvastatin (LIPITOR) 80 MG tablet Take 80 mg by mouth daily.  . benazepril-hydrochlorthiazide (LOTENSIN HCT) 20-25 MG per tablet Take 1 tablet by mouth daily.   . Dulaglutide (TRULICITY) A999333 0000000 SOPN Trulicity A999333 [id number] mL subcutaneous pen injector  0.75 MG ONCE A WEEK SUBCUTANEOUS 30 DAYS  . GLUCOS-CHONDROIT-CA-MG-C-D PO Take 1 tablet by mouth every morning.   [name] LEVEMIR FLEXTOUCH 100 UNIT/ML Pen Inject 40 Units into the skin daily at 10 pm.   . metFORMIN (GLUCOPHAGE-XR) 500 MG 24 hr tablet Take 2,000 mg by mouth daily with supper.  . Omega-3 Fatty Acids (FISH OIL) 1000 MG CAPS Take 1,000 mg by mouth every morning.     12 system ROS was negative unless otherwise noted in HPI   Observations/Objective:  DATA  Right LE Arterial Duplex (07-12-19): Right Stent(s): +---------------+--------++---------++ Prox to Stent  118     biphasic  +---------------+--------++---------++ Proximal Stent 131     triphasic +---------------+--------++---------++ Mid Stent      121     triphasic +---------------+--------++---------++ Distal  Stent   72      triphasic +---------------+--------++---------++ Distal to Stent86 / 228triphasic +---------------+--------++---------++ Summary: Right: Patent stent with with no evidence for restenosis. Increased velocity noted in the distal femoral artery suggestive of 50 - 74% stenosis. No significant change compared to 09-10-18.    ABI Findings  (07-12-19): +---------+------------------+-----+---------+--------+ Right    Rt Pressure (mmHg)IndexWaveform Comment  +---------+------------------+-----+---------+--------+ Brachial 153                                      +---------+------------------+-----+---------+--------+ PTA      131               0.86 triphasic         +---------+------------------+-----+---------+--------+ DP       133               0.87 triphasic         +---------+------------------+-----+---------+--------+ Great Toe84                0.55 Abnormal          +---------+------------------+-----+---------+--------+  +---------+------------------+-----+--------+-------+ Left     Lt Pressure (mmHg)IndexWaveformComment +---------+------------------+-----+--------+-------+ Brachial 137                                    +---------+------------------+-----+--------+-------+ PTA      103               0.67 biphasic        +---------+------------------+-----+--------+-------+ DP       125               0.82 biphasic        +---------+------------------+-----+--------+-------+ Great Toe89                0.58 Abnormal        +---------+------------------+-----+--------+-------+  +-------+-----------+-----------+------------+------------+ ABI/TBIToday's ABIToday's TBIPrevious ABIPrevious TBI +-------+-----------+-----------+------------+------------+ Right  0.87       0.55       0.86        0.57         +-------+-----------+-----------+------------+------------+ Left   0.82       0.58       0.63        0.64         +-------+-----------+-----------+------------+------------+ Summary: Right: Resting right ankle-brachial index indicates mild right lower extremity arterial disease. The right toe-brachial index is abnormal.  Left: Resting left ankle-brachial index indicates mild left lower extremity arterial disease. The left toe-brachial  index is abnormal.  Stable in the right, improved arterial perfusion in the left LE.    Assessment and Plan: Melanie Atkins is a 68 y.o. female who iss/p right superficial femoral artery angioplasty with drug-coated balloon (6 x 125) followed by stenting (7 x 100, 7 x 60 Inova)on 01-30-18 by Dr. Oneida Alar for nonhealing wound right foot. 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 Sharol Given; this has healed.   She reports she can walk as far as she wants with no problems in her legs. She states that she has no wounds in her lower extremities.  She is taking water aerobics.   She quit smoking in 1978. She has uncontrolled DM, last A1C result on file was 9.5 on 01-28-18, but she states this has improved, which is her primary atherosclerotic risk factor.   Continue daily  statin and 81 mg ASA.   PLAN:  Walk at least 30 minutes daily.   Based on the patient's vascular studies and HPI, pt will return to clinic in 9 months with ABI's and rightLE arterial duplex. I advised her to notify us if she develops concerns re the circulation in her feet or legs  I spent 9 minutes with the patient via telephone encounter.   Gabrielle Dare Nickel [facility] Office: [phone]  07/12/2019, 4:49 PM

## 2019-07-13 ENCOUNTER — Encounter (HOSPITAL_COMMUNITY): Payer: Self-pay

## 2019-07-13 ENCOUNTER — Other Ambulatory Visit: Payer: Self-pay

## 2019-07-13 ENCOUNTER — Emergency Department (HOSPITAL_COMMUNITY)
Admission: EM | Admit: 2019-07-13 | Discharge: 2019-07-13 | Disposition: A | Discharge status: Home / Self Care | Payer: 59 | Attending: Emergency Medicine | Admitting: Emergency Medicine

## 2019-07-13 ENCOUNTER — Emergency Department (HOSPITAL_COMMUNITY): Payer: 59

## 2019-07-13 DIAGNOSIS — Z856 Personal history of leukemia: Secondary | ICD-10-CM | POA: Insufficient documentation

## 2019-07-13 DIAGNOSIS — Z87891 Personal history of nicotine dependence: Secondary | ICD-10-CM | POA: Insufficient documentation

## 2019-07-13 DIAGNOSIS — I10 Essential (primary) hypertension: Secondary | ICD-10-CM | POA: Insufficient documentation

## 2019-07-13 DIAGNOSIS — S5402XA Injury of ulnar nerve at forearm level, left arm, initial encounter: Secondary | ICD-10-CM

## 2019-07-13 DIAGNOSIS — Z23 Encounter for immunization: Secondary | ICD-10-CM | POA: Insufficient documentation

## 2019-07-13 DIAGNOSIS — E119 Type 2 diabetes mellitus without complications: Secondary | ICD-10-CM | POA: Diagnosis not present

## 2019-07-13 DIAGNOSIS — S51812A Laceration without foreign body of left forearm, initial encounter: Secondary | ICD-10-CM | POA: Insufficient documentation

## 2019-07-13 DIAGNOSIS — Z79899 Other long term (current) drug therapy: Secondary | ICD-10-CM | POA: Diagnosis not present

## 2019-07-13 DIAGNOSIS — W1830XA Fall on same level, unspecified, initial encounter: Secondary | ICD-10-CM | POA: Diagnosis not present

## 2019-07-13 DIAGNOSIS — Y929 Unspecified place or not applicable: Secondary | ICD-10-CM | POA: Diagnosis not present

## 2019-07-13 DIAGNOSIS — W25XXXA Contact with sharp glass, initial encounter: Secondary | ICD-10-CM | POA: Insufficient documentation

## 2019-07-13 DIAGNOSIS — Y9301 Activity, walking, marching and hiking: Secondary | ICD-10-CM | POA: Diagnosis not present

## 2019-07-13 DIAGNOSIS — Z20828 Contact with and (suspected) exposure to other viral communicable diseases: Secondary | ICD-10-CM | POA: Diagnosis not present

## 2019-07-13 DIAGNOSIS — Z7984 Long term (current) use of oral hypoglycemic drugs: Secondary | ICD-10-CM | POA: Insufficient documentation

## 2019-07-13 DIAGNOSIS — Y999 Unspecified external cause status: Secondary | ICD-10-CM | POA: Insufficient documentation

## 2019-07-13 MED ORDER — HYDROCODONE-ACETAMINOPHEN 5-325 MG PO TABS: 1.0000 | ORAL_TABLET | Freq: Four times a day (QID) | ORAL | 0 refills | Status: AC | PRN

## 2019-07-13 MED ORDER — CEPHALEXIN 500 MG PO CAPS: ORAL_CAPSULE | ORAL | 0 refills | Status: AC

## 2019-07-13 MED ORDER — TETANUS-DIPHTH-ACELL PERTUSSIS 5-2.5-18.5 LF-MCG/0.5 IM SUSP
0.5000 mL | Freq: Once | INTRAMUSCULAR | Status: AC
  Administered 2019-07-13: 0.5 mL via INTRAMUSCULAR
  Filled 2019-07-13: qty 0.5

## 2019-07-13 MED ORDER — ONDANSETRON 4 MG PO TBDP
4.0000 mg | ORAL_TABLET | Freq: Once | ORAL | Status: AC
  Administered 2019-07-13: 4 mg via ORAL
  Filled 2019-07-13: qty 1

## 2019-07-13 MED ORDER — HYDROCODONE-ACETAMINOPHEN 5-325 MG PO TABS
1.0000 | ORAL_TABLET | Freq: Once | ORAL | Status: AC
  Administered 2019-07-13: 1 via ORAL
  Filled 2019-07-13: qty 1

## 2019-07-13 MED ORDER — LIDOCAINE-EPINEPHRINE 2 %-1:100000 IJ SOLN
20.0000 mL | Freq: Once | INTRAMUSCULAR | Status: AC
  Administered 2019-07-13: 20 mL via INTRADERMAL
  Filled 2019-07-13: qty 20

## 2019-07-13 MED ORDER — CEPHALEXIN 250 MG PO CAPS
1000.0000 mg | ORAL_CAPSULE | Freq: Once | ORAL | Status: AC
  Administered 2019-07-13: 1000 mg via ORAL
  Filled 2019-07-13: qty 4

## 2019-07-13 NOTE — ED Notes (Signed)
Patient verbalizes understanding of ED visit and discharge instructions. Medications reviewed, follow up instructions were clarified. Opportunity for questions and answers provided.

## 2019-07-13 NOTE — Discharge Instructions (Signed)
Ask Dr. Amedeo Plenty about suture removal (likely 7-8 days) Which he will handle. Take your antibiotic Return to the ER for uncontrolled bleeding or new concerning symptoms  You were given narcotic and or sedative medications while in the emergency department. Do not drive. Do not use machinery or power tools. Do not sign legal documents. Do not drink alcohol. Do not take sleeping pills. Do not supervise children by yourself. Do not participate in activities that require climbing or being in high places.

## 2019-07-13 NOTE — ED Triage Notes (Signed)
Pt arrived via [facility].   Pt was walking out of an [employer] store when she missed a step and "went flying". Pt was carrying a bottle of rum when she fell and so broke the bottle and cut up her arm.   Per [facility] patient had what looked like a puncture wound and laceration on her left forearm.   Pt in no apparent distress upon arrival, A&Ox4.

## 2019-07-13 NOTE — ED Notes (Signed)
Patient transported to X-ray 

## 2019-07-14 LAB — SARS CORONAVIRUS 2 (TAT 6-24 HRS): SARS Coronavirus 2: NEGATIVE

## 2019-07-16 NOTE — ED Provider Notes (Addendum)
[facility] EMERGENCY DEPARTMENT Provider Note   CSN: [id number] Arrival date & time: 07/13/19  1726     History   Chief Complaint Chief Complaint  Patient presents with  . Extremity Laceration    HPI Melanie Atkins is a 68 y.o. female who presents to the ER with a cc of Laceration. The patient was walking today out of the [facility] store when she had a mechanical fall and fell onto the ground.  Her right bilateral opened up and she suffered a laceration to her left forearm.  Patient is unsure of her last tetanus vaccination.  She complains of loss of feeling of her left pinky finger.  She is not hit her head or lose consciousness.  She denies any other significant injury.     HPI  Past Medical History:  Diagnosis Date  . 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 High cholesterol   . History of blood transfusion ~ 2002   "related to hairy cell leukemia"  . Hypertension   . Type II diabetes mellitus [facility])     Patient Active Problem List   Diagnosis Date Noted  . Amputated toe of right foot ([place]) 02/09/2018  . Subacute osteomyelitis, right ankle and foot ([place])   . Diabetic foot infection ([place]) 01/27/2018  . Essential hypertension 01/27/2018  . Uncontrolled type II diabetes mellitus ([place]) 01/27/2018  . Hairy cell leukemia ([place]) 11/30/2014    Past Surgical History:  Procedure Laterality Date  . ABDOMINAL AORTOGRAM W/LOWER EXTREMITY Right 01/30/2018   Procedure: ABDOMINAL AORTOGRAM W/LOWER EXTREMITY;  Surgeon: Elam Dutch, MD;  Location: [place] CV LAB;  Service: Cardiovascular;  Laterality: Right;  . ABDOMINAL HYSTERECTOMY    . AMPUTATION Right 01/30/2018   Procedure: RIGHT FOOT 2ND RAY AMPUTATION;  Surgeon: [name], MD;  Location: [place];  Service: Orthopedics;  Laterality: Right;  . CESAREAN SECTION  1994  . PERIPHERAL VASCULAR INTERVENTION Right 01/30/2018   Procedure: PERIPHERAL VASCULAR INTERVENTION;  Surgeon: Elam Dutch, MD;  Location:  [place] CV LAB;  Service: Cardiovascular;  Laterality: Right;  . TONSILLECTOMY       OB History   No obstetric history on file.      Home Medications    Prior to Admission medications   Medication Sig Start Date End Date Taking? Authorizing Provider  atorvastatin (LIPITOR) 80 MG tablet Take 80 mg by mouth daily. 11/25/17   [provider]  benazepril-hydrochlorthiazide (LOTENSIN HCT) 20-25 MG per tablet Take 1 tablet by mouth daily.  11/26/12   [provider]  cephALEXin (KEFLEX) 500 MG capsule 2 caps po bid x 7 days 07/13/19   Margarita Mail, PA-C  Dulaglutide (TRULICITY) A999333 0000000 SOPN Trulicity A999333 [id number] mL subcutaneous pen injector  0.75 MG ONCE A WEEK SUBCUTANEOUS 30 DAYS    [provider]  GLUCOS-CHONDROIT-CA-MG-C-D PO Take 1 tablet by mouth every morning.     [provider]  HYDROcodone-acetaminophen (NORCO) 5-325 MG tablet Take 1 tablet by mouth every 6 (six) hours as needed for severe pain. 07/13/19   Margarita Mail, PA-C  LEVEMIR FLEXTOUCH 100 UNIT/ML Pen Inject 40 Units into the skin daily at 10 pm.  01/13/18   [provider]  metFORMIN (GLUCOPHAGE-XR) 500 MG 24 hr tablet Take 2,000 mg by mouth daily with supper. 01/13/18   [provider]  Omega-3 Fatty Acids (FISH OIL) 1000 MG CAPS Take 1,000 mg by mouth every morning.     [provider]  Family History History reviewed. No pertinent family history.  Social History Social History   Tobacco Use  . Smoking status: Former Smoker    Packs/day: 0.50    Years: 4.00    Pack years: 2.00    Types: Cigarettes    Quit date: 1978    Years since quitting: 42.6  . Smokeless tobacco: Never Used  Substance Use Topics  . Alcohol use: Yes    Alcohol/week: 4.0 standard drinks    Types: 4 Glasses of wine per week  . Drug use: No     Allergies   Patient has no known allergies.   Review of Systems Review of Systems Ten systems reviewed and are  negative for acute change, except as noted in the HPI.    Physical Exam Updated Vital Signs BP 114/75 (BP Location: Right Arm)   Pulse (!) 102   Temp 97.9 F (36.6 C) (Oral)   Resp (!) 29   Ht 5\' 10"  (1.778 m)   Wt 104.3 kg   SpO2 95%   BMI 33.00 kg/m   Physical Exam Vitals signs and nursing note reviewed.  Constitutional:      General: She is not in acute distress.    Appearance: She is well-developed. She is not diaphoretic.  HENT:     Head: Normocephalic and atraumatic.  Eyes:     General: No scleral icterus.    Conjunctiva/sclera: Conjunctivae normal.  Neck:     Musculoskeletal: Normal range of motion.  Cardiovascular:     Rate and Rhythm: Normal rate and regular rhythm.     Heart sounds: Normal heart sounds. No murmur. No friction rub. No gallop.   Pulmonary:     Effort: Pulmonary effort is normal. No respiratory distress.     Breath sounds: Normal breath sounds.  Abdominal:     General: Bowel sounds are normal. There is no distension.     Palpations: Abdomen is soft. There is no mass.     Tenderness: There is no abdominal tenderness. There is no guarding.  Musculoskeletal:     Comments: Stellate and jagged laceration of the left forearm on the ulnar side.  Normal capillary refill in the fingers.  Patient has difficulty opposing the thumb and the pinky secondary to sensation loss in the pinky finger.  Otherwise able to place the thumb on the base of the fifth finger.  Strength appears normal.  Skin:    General: Skin is warm and dry.  Neurological:     Mental Status: She is alert and oriented to person, place, and time.  Psychiatric:        Behavior: Behavior normal.          ED Treatments / Results  Labs (all labs ordered are listed, but only abnormal results are displayed) Labs Reviewed  SARS CORONAVIRUS 2 (TAT 6-12 HRS)    EKG None  Radiology No results found.  Procedures .Marland KitchenLaceration Repair  Date/Time: 07/16/2019 2:05 PM Performed by:  Margarita Mail, PA-C Authorized by: Margarita Mail, PA-C   Consent:    Consent obtained:  Verbal   Consent given by:  Patient   Risks discussed:  Infection, need for additional repair, pain, poor cosmetic result and poor wound healing   Alternatives discussed:  No treatment and delayed treatment Universal protocol:    Procedure explained and questions answered to patient or proxy's satisfaction: yes     Relevant documents present and verified: yes     Test results available and properly labeled: yes  Imaging studies available: yes     Required blood products, implants, devices, and special equipment available: yes     Site/side marked: yes     Immediately prior to procedure, a time out was called: yes     Patient identity confirmed:  Verbally with patient Anesthesia (see MAR for exact dosages):    Anesthesia method:  Local infiltration   Local anesthetic:  Lidocaine 2% WITH epi Laceration details:    Location:  Shoulder/arm   Shoulder/arm location:  L lower arm   Length (cm):  9   Depth (mm):  30 Repair type:    Repair type:  Complex Pre-procedure details:    Preparation:  Patient was prepped and draped in usual sterile fashion Exploration:    Hemostasis achieved with:  Epinephrine   Wound exploration: wound explored through full range of motion     Wound extent: fascia violated, muscle damage and nerve damage     Wound extent: no areolar tissue violation noted, no foreign bodies/material noted, no tendon damage noted, no underlying fracture noted and no vascular damage noted     Contaminated: no   Treatment:    Area cleansed with:  Betadine   Amount of cleaning:  Extensive   Irrigation solution:  Sterile saline   Debridement:  None   Undermining:  None Skin repair:    Repair method:  Sutures   Suture size:  3-0   Suture material:  Prolene   Suture technique:  Simple interrupted   Number of sutures:  11 Approximation:    Approximation:  Close Post-procedure  details:    Dressing:  Non-adherent dressing   Patient tolerance of procedure:  Tolerated well, no immediate complications .Marland KitchenLaceration Repair  Date/Time: 07/16/2019 2:06 PM Performed by: Margarita Mail, PA-C Authorized by: Margarita Mail, PA-C   Consent:    Consent obtained:  Verbal   Consent given by:  Patient   Risks discussed:  Infection, need for additional repair, pain, poor cosmetic result and poor wound healing   Alternatives discussed:  No treatment and delayed treatment Universal protocol:    Procedure explained and questions answered to patient or proxy's satisfaction: yes     Relevant documents present and verified: yes     Test results available and properly labeled: yes     Imaging studies available: yes     Required blood products, implants, devices, and special equipment available: yes     Site/side marked: yes     Immediately prior to procedure, a time out was called: yes     Patient identity confirmed:  Verbally with patient Anesthesia (see MAR for exact dosages):    Anesthesia method:  Local infiltration Laceration details:    Location:  Shoulder/arm   Shoulder/arm location:  L lower arm   Length (cm):  2 Repair type:    Repair type:  Simple Pre-procedure details:    Preparation:  Patient was prepped and draped in usual sterile fashion Exploration:    Wound exploration: wound explored through full range of motion     Wound extent: no fascia violation noted, no foreign bodies/material noted, no muscle damage noted, no nerve damage noted, no tendon damage noted, no underlying fracture noted and no vascular damage noted   Treatment:    Area cleansed with:  Betadine   Amount of cleaning:  Standard   Irrigation solution:  Sterile saline Skin repair:    Repair method:  Sutures   Suture size:  3-0   Suture material:  Prolene  Suture technique:  Simple interrupted   Number of sutures:  2   (including critical care time)  Medications Ordered in ED  Medications  Tdap (BOOSTRIX) injection 0.5 mL (0.5 mLs Intramuscular Given 07/13/19 1957)  HYDROcodone-acetaminophen (NORCO/VICODIN) 5-325 MG per tablet 1 tablet (1 tablet Oral Given 07/13/19 1953)  ondansetron (ZOFRAN-ODT) disintegrating tablet 4 mg (4 mg Oral Given 07/13/19 1954)  cephALEXin (KEFLEX) capsule 1,000 mg (1,000 mg Oral Given 07/13/19 1953)  lidocaine-EPINEPHrine (XYLOCAINE W/EPI) 2 %-1:100000 (with pres) injection 20 mL (20 mLs Intradermal Given 07/13/19 2027)     Initial Impression / Assessment and Plan / ED Course  I have reviewed the triage vital signs and the nursing notes.  Pertinent labs & imaging results that were available during my care of the patient were reviewed by me and considered in my medical decision making (see chart for details).        Patient with laceration of the left forearm, I spoke with Dr. Amedeo Plenty who asked that we superficially close the wound and have the patient see him in his office tomorrow at 12:00.  Do not think she has any vascular or tendon damage however she does apparently have ulnar nerve damage.  She is right-hand dominant.  Her tetanus vaccination was updated.  She understands directions to follow-up tomorrow.  Pain medications ordered.  Discussed home care and return precautions.  Final Clinical Impressions(s) / ED Diagnoses   Final diagnoses:  Laceration of left forearm, initial encounter  Ulnar nerve damage, left, initial encounter    ED Discharge Orders         Ordered    HYDROcodone-acetaminophen (NORCO) 5-325 MG tablet  Every 6 hours PRN     07/13/19 2133    cephALEXin (KEFLEX) 500 MG capsule     07/13/19 2133           Margarita Mail, PA-C 07/16/19 1410    Margarita Mail, PA-C 07/16/19 1537    Carmin Muskrat, MD 07/17/19 2214

## 2019-10-08 ENCOUNTER — Ambulatory Visit: Payer: 59 | Admitting: Podiatry

## 2019-10-08 ENCOUNTER — Encounter: Payer: Self-pay | Admitting: Podiatry

## 2019-10-08 ENCOUNTER — Other Ambulatory Visit: Payer: Self-pay

## 2019-10-08 VITALS — BP 113/70

## 2019-10-08 DIAGNOSIS — E1165 Type 2 diabetes mellitus with hyperglycemia: Secondary | ICD-10-CM | POA: Diagnosis not present

## 2019-10-08 DIAGNOSIS — E119 Type 2 diabetes mellitus without complications: Secondary | ICD-10-CM

## 2019-10-08 DIAGNOSIS — S98131A Complete traumatic amputation of one right lesser toe, initial encounter: Secondary | ICD-10-CM | POA: Diagnosis not present

## 2019-10-11 ENCOUNTER — Encounter: Payer: Self-pay | Admitting: Podiatry

## 2019-10-11 NOTE — Progress Notes (Signed)
Subjective: Melanie Atkins presents today referred by Aretta Nip, MD for diabetic foot evaluation.  Patient relates 3 year history of diabetes.  Patient denies any history of foot wounds.  Patient denies any history of numbness, tingling, burning, pins/needles sensations.  Past Medical History:  Diagnosis Date  . 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 High cholesterol   . History of blood transfusion ~ 2002   "related to hairy cell leukemia"  . Hypertension   . Type II diabetes mellitus [facility])     Patient Active Problem List   Diagnosis Date Noted  . Amputated toe of right foot ([place]) 02/09/2018  . Subacute osteomyelitis, right ankle and foot ([place])   . Diabetic foot infection ([place]) 01/27/2018  . Essential hypertension 01/27/2018  . Uncontrolled type II diabetes mellitus ([place]) 01/27/2018  . Hairy cell leukemia ([place]) 11/30/2014    Past Surgical History:  Procedure Laterality Date  . ABDOMINAL AORTOGRAM W/LOWER EXTREMITY Right 01/30/2018   Procedure: ABDOMINAL AORTOGRAM W/LOWER EXTREMITY;  Surgeon: Elam Dutch, MD;  Location: [place];  Service: Cardiovascular;  Laterality: Right;  . ABDOMINAL HYSTERECTOMY    . AMPUTATION Right 01/30/2018   Procedure: RIGHT FOOT 2ND RAY AMPUTATION;  Surgeon: [name], MD;  Location: [place];  Service: Orthopedics;  Laterality: Right;  . CESAREAN SECTION  1994  . PERIPHERAL VASCULAR INTERVENTION Right 01/30/2018   Procedure: PERIPHERAL VASCULAR INTERVENTION;  Surgeon: Elam Dutch, MD;  Location: [place] [facility];  Service: Cardiovascular;  Laterality: Right;  . TONSILLECTOMY      Current Outpatient Medications on File Prior to Visit  Medication Sig Dispense Refill  . atorvastatin (LIPITOR) 10 MG tablet Take 10 mg by mouth daily.    [name] atorvastatin (LIPITOR) 80 MG tablet Take 80 mg by mouth daily.  0  . benazepril (LOTENSIN) 20 MG tablet Take 20 mg by mouth daily.    . benazepril-hydrochlorthiazide (LOTENSIN  HCT) 20-25 MG per tablet Take 1 tablet by mouth daily.     . cephALEXin (KEFLEX) 500 MG capsule 2 caps po bid x 7 days 28 capsule 0  . Dulaglutide (TRULICITY) A999333 0000000 SOPN Trulicity A999333 [id number] mL subcutaneous pen injector  0.75 MG ONCE A WEEK SUBCUTANEOUS 30 DAYS    . GLUCOS-CHONDROIT-CA-MG-C-D PO Take 1 tablet by mouth every morning.     [name] HYDROcodone-acetaminophen (NORCO) 5-325 MG tablet Take 1 tablet by mouth every 6 (six) hours as needed for severe pain. 6 tablet 0  . LEVEMIR FLEXTOUCH 100 UNIT/ML Pen Inject 40 Units into the skin daily at 10 pm.     . metFORMIN (GLUCOPHAGE-XR) 500 MG 24 hr tablet Take 2,000 mg by mouth daily with supper.    . Omega-3 Fatty Acids (FISH OIL) 1000 MG CAPS Take 1,000 mg by mouth every morning.      No current facility-administered medications on file prior to visit.      No Known Allergies  Social History   Occupational History  . Not on file  Tobacco Use  . Smoking status: Former Smoker    Packs/day: 0.50    Years: 4.00    Pack years: 2.00    Types: Cigarettes    Quit date: 1978    Years since quitting: 42.9  . Smokeless tobacco: Never Used  Substance and Sexual Activity  . Alcohol use: Yes    Alcohol/week: 4.0 standard drinks    Types: 4 Glasses of wine per week  . Drug use: No  .  Sexual activity: Not Currently    No family history on file.  Immunization History  Administered Date(s) Administered  . Tdap 07/13/2019    Review of systems: Positive Findings in bold print.  Constitutional:  chills, fatigue, fever, sweats, weight change Communication: Optometrist, sign Ecologist, hand writing, iPad/Android device Head: headaches, head injury Eyes: changes in vision, eye pain, glaucoma, cataracts, macular degeneration, diplopia, glare,  light sensitivity, eyeglasses or contacts, blindness Ears nose mouth throat: hearing impaired, hearing aids,  ringing in ears, deaf, sign language,  vertigo, nosebleeds,  rhinitis,  cold  sores, snoring, swollen glands Cardiovascular: HTN, edema, arrhythmia, pacemaker in place, defibrillator in place, chest pain/tightness, chronic anticoagulation, blood clot, heart failure, MI Peripheral Vascular: leg cramps, varicose veins, blood clots, lymphedema, varicosities Respiratory:  asthma, difficulty breathing, denies congestion, SOB, wheezing, cough, emphysema Gastrointestinal: change in appetite or weight, abdominal pain, constipation, diarrhea, nausea, vomiting, vomiting blood, change in bowel habits, abdominal pain, jaundice, rectal bleeding, hemorrhoids, GERD Genitourinary:  nocturia,  pain on urination, polyuria,  blood in urine, Foley catheter, urinary urgency, ESRD on hemodialysis Musculoskeletal: amputation, cramping, stiff joints, painful joints, decreased joint motion, fractures, OA, gout, hemiplegia, paraplegia, uses cane, wheelchair bound, uses walker, uses rollator Skin: +changes in toenails, color change, dryness, itching, mole changes,  rash, wound(s) Neurological: headaches, numbness in feet, paresthesias in feet, burning in feet, fainting,  seizures, change in speech, migraines, memory problems/poor historian, cerebral palsy, weakness, paralysis, CVA, TIA Endocrine: diabetes, hypothyroidism, hyperthyroidism,  goiter, dry mouth, flushing, heat intolerance, cold intolerance,  excessive thirst, denies polyuria,  nocturia Hematological:  easy bleeding, excessive bleeding, easy bruising, enlarged lymph nodes, on long term blood thinner, history of past transusions Allergy/immunological:  hives, eczema, frequent infections, multiple drug allergies, seasonal allergies, transplant recipient, multiple food allergies Psychiatric:  anxiety, depression, mood disorder, suicidal ideations, hallucinations, insomnia  Objective: Vitals:   10/08/19 1332  BP: 113/70   Vascular Examination: Capillary refill time <3 secs x 10 digits.  Dorsalis pedis pulses palpable .  Posterior tibial  pulses palpable.  Digital hair not present x 10 digits.  Skin temperature gradient WNL b/l.  Dermatological Examination: Skin with normal turgor, texture and tone b/l  Toenails 1-5 b/l discolored, thick, dystrophic with subungual debris and pain with palpation to nailbeds due to thickness of nails.  Musculoskeletal: Muscle strength 5/5 to all LE muscle groups.  Neurological: Sensation intact with 10 gram monofilament.  Vibratory sensation intact.  Assessment: 1. NIDDM 2. Encounter for diabetic foot examination  Plan: 1. Discussed diabetic foot care principles. Literature dispensed on today. 2. Patient to continue soft, supportive shoe gear daily. 3. Patient to report any pedal injuries to medical professional immediately. 4. Follow up one year. 5. Patient/POA to call should there be a concern in the interim. 6. Patient will benefit from diabetic shoes to offload pressure sensitive areas.  I will have the patient follow-up with Liliane Channel to make the diabetic shoes. 7. Given that patient has a history of amputation of the right foot hallux, I believe patient will benefit from diabetic shoes with toe filler.

## 2019-10-28 ENCOUNTER — Other Ambulatory Visit: Payer: Self-pay

## 2019-10-28 ENCOUNTER — Ambulatory Visit (INDEPENDENT_AMBULATORY_CARE_PROVIDER_SITE_OTHER): Payer: 59 | Admitting: Orthotics

## 2019-10-28 DIAGNOSIS — S98131A Complete traumatic amputation of one right lesser toe, initial encounter: Secondary | ICD-10-CM

## 2019-10-28 DIAGNOSIS — E1165 Type 2 diabetes mellitus with hyperglycemia: Secondary | ICD-10-CM | POA: Diagnosis not present

## 2019-10-28 DIAGNOSIS — E119 Type 2 diabetes mellitus without complications: Secondary | ICD-10-CM

## 2019-10-28 NOTE — Progress Notes (Signed)
Patient came into today to be cast for Custom Foot Orthotics. Upon recommendation of Dr. Posey Pronto Patient presents with hx of toe amputation Goals are FF cushioning, arch support, protection from sheer forces Plan vendor [place]

## 2019-11-23 ENCOUNTER — Other Ambulatory Visit: Payer: Self-pay

## 2019-11-23 ENCOUNTER — Ambulatory Visit: Payer: 59 | Admitting: Orthotics

## 2019-11-23 DIAGNOSIS — S98131A Complete traumatic amputation of one right lesser toe, initial encounter: Secondary | ICD-10-CM

## 2019-11-23 DIAGNOSIS — E119 Type 2 diabetes mellitus without complications: Secondary | ICD-10-CM

## 2019-11-23 DIAGNOSIS — E1165 Type 2 diabetes mellitus with hyperglycemia: Secondary | ICD-10-CM

## 2019-11-23 NOTE — Progress Notes (Signed)
Patient came in to be foam cast per request from [facility]

## 2019-11-25 ENCOUNTER — Other Ambulatory Visit: Payer: 59 | Admitting: Orthotics

## 2019-12-10 ENCOUNTER — Encounter: Payer: 59 | Attending: Internal Medicine | Admitting: Registered"

## 2019-12-10 ENCOUNTER — Other Ambulatory Visit: Payer: Self-pay

## 2019-12-10 ENCOUNTER — Encounter: Payer: Self-pay | Admitting: Registered"

## 2019-12-10 DIAGNOSIS — E1165 Type 2 diabetes mellitus with hyperglycemia: Secondary | ICD-10-CM | POA: Diagnosis present

## 2019-12-10 NOTE — Patient Instructions (Addendum)
To help with high triglerides you may want to increase your fish oil supplement and continue eating fish. American Heart Association 2-3 per week. 2,000 mg of fish oil is safe. Before you increase it more than that, check in with your primary care doctor. Also consider limiting wine to 4 oz per day.  Exercise Goal: Use resistance bands daily Continue getting back into the pool - 3x week. When you move, start riding your bike again.  Plan:  Aim for 3 Carb Choices per meal (30-45 grams) +/- 1 either way  Aim for 0-1 Carbs per snack if hungry Consider skipping the evening snacking if not hungry. Include protein with your meals and snacks Consider reading food labels for Total Carbohydrate of foods Consider checking BG at alternate times per day  Continue taking medication as directed by MD

## 2019-12-10 NOTE — Progress Notes (Signed)
Diabetes Self-Management Education  Visit Type: First/Initial  Appt. Start Time: 1015 Appt. End Time: [id number]  12/10/2019  [name], identified by name and date of birth, is a 69 y.o. female with a diagnosis of Diabetes: Type 2.   ASSESSMENT  There were no vitals taken for this visit. There is no height or weight on file to calculate BMI.  Pt states she knows what she should be doing because she has been a [employer] member since 84s. Pt states she knows she should drink less wine.  Pt states she snacks while watching TV because husband is snacking.   Per patient dietary recall is eating variety of food groups, some meals may be more than the recommended carbohydrate intake.  Sleep: sleeps fine, a lot of stress getting ready to retire [place]  Diabetes Self-Management Education - 12/10/19 1044      Visit Information   Visit Type  First/Initial      Initial Visit   Diabetes Type  Type 2    Are you currently following a meal plan?  Yes    What type of meal plan do you follow?  low fat, low carb (trying)    Are you taking your medications as prescribed?  Yes   trulicty, levemir 68 u pm   Date Diagnosed  2016      Health Coping   How would you rate your overall health?  Good      Psychosocial Assessment   Patient Belief/Attitude about Diabetes  Motivated to manage diabetes    How often do you need to have someone help you when you read instructions, pamphlets, or other written materials from your doctor or pharmacy?  1 - Never    What is the last grade level you completed in school?  BA      Complications   Last HgB A1C per patient/outside source  7.2 %    How often do you check your blood sugar?  1-2 times/day    Fasting Blood glucose range (mg/dL)  70-129   94-130; averaging close to 100   Have you had a dilated eye exam in the past 12 months?  Yes    Have you had a dental exam in the past 12 months?  Yes    Are you checking  your feet?  No   gets a pedicure 1x month     Dietary Intake   Breakfast  bran cereal, blueberries OR cheese toast    Snack (morning)  none    Lunch  (small) tuna melt OR shrimp & salad OR yogurt low fat sometimes [place]    Snack (afternoon)  none    Dinner  main meal shrimp stir fry OR sloppy joes, cucumber OR chicken, salad, potatoes OR meatloaf, potatotes, vegetable    Snack (evening)  cheese crackers OR humus crackers    Beverage(s)  coffee, unsweet ice tea, 3 glasses of wine      Exercise   Exercise Type  Light (walking / raking leaves)    How many days per week to you exercise?  4    How many minutes per day do you exercise?  45    Total minutes per week of exercise  180      Patient Education   Previous Diabetes Education  No   has read books      Individualized Plan for Diabetes Self-Management Training:   Learning Objective:  Patient  will have a greater understanding of diabetes self-management. Patient education plan is to attend individual and/or group sessions per assessed needs and concerns.   Plan:   Patient Instructions  To help with high triglerides you may want to increase your fish oil supplement and continue eating fish. American Heart Association 2-3 per week. 2,000 mg of fish oil is safe. Before you increase it more than that, check in with your primary care doctor. Also consider limiting wine to 4 oz per day.  Exercise Goal: Use resistance bands daily Continue getting back into the pool - 3x week. When you move, start riding your bike again.  Plan:  Aim for 3 Carb Choices per meal (30-45 grams) +/- 1 either way  Aim for 0-1 Carbs per snack if hungry Consider skipping the evening snacking if not hungry. Include protein with your meals and snacks Consider reading food labels for Total Carbohydrate of foods Consider checking BG at alternate times per day  Continue taking medication as directed by MD   Expected Outcomes:     Education material  provided: ADA - How to Thrive: A Guide for Your Journey with Diabetes, A1C conversion sheet and Carbohydrate counting sheet  If problems or questions, patient to contact team via:  Phone and MyChart  Future DSME appointment:

## 2019-12-21 ENCOUNTER — Other Ambulatory Visit: Payer: Self-pay

## 2019-12-21 ENCOUNTER — Ambulatory Visit: Payer: 59 | Admitting: Orthotics

## 2019-12-21 DIAGNOSIS — S98131A Complete traumatic amputation of one right lesser toe, initial encounter: Secondary | ICD-10-CM

## 2019-12-21 DIAGNOSIS — E119 Type 2 diabetes mellitus without complications: Secondary | ICD-10-CM

## 2019-12-21 DIAGNOSIS — E1165 Type 2 diabetes mellitus with hyperglycemia: Secondary | ICD-10-CM

## 2019-12-21 NOTE — Progress Notes (Signed)
Patient came in today to pick up custom made foot orthotics.  The goals were accomplished and the patient reported no dissatisfaction with said orthotics.  Patient was advised of breakin period and how to report any issues. 

## 2020-04-05 DIAGNOSIS — Z89421 Acquired absence of other right toe(s): Secondary | ICD-10-CM | POA: Diagnosis not present

## 2020-04-05 DIAGNOSIS — I10 Essential (primary) hypertension: Secondary | ICD-10-CM | POA: Diagnosis not present

## 2020-04-05 DIAGNOSIS — E119 Type 2 diabetes mellitus without complications: Secondary | ICD-10-CM | POA: Diagnosis not present

## 2020-04-05 DIAGNOSIS — E7800 Pure hypercholesterolemia, unspecified: Secondary | ICD-10-CM | POA: Diagnosis not present

## 2020-04-05 DIAGNOSIS — C9140 Hairy cell leukemia not having achieved remission: Secondary | ICD-10-CM | POA: Diagnosis not present

## 2020-05-25 IMAGING — CR LEFT FOREARM - 2 VIEW
2 series · 2 of 2 positions shown · non-contrast
Comparison: None.

CLINICAL DATA: Retained glass foreign body

EXAM:
LEFT FOREARM - 2 VIEW

[forearm ap]
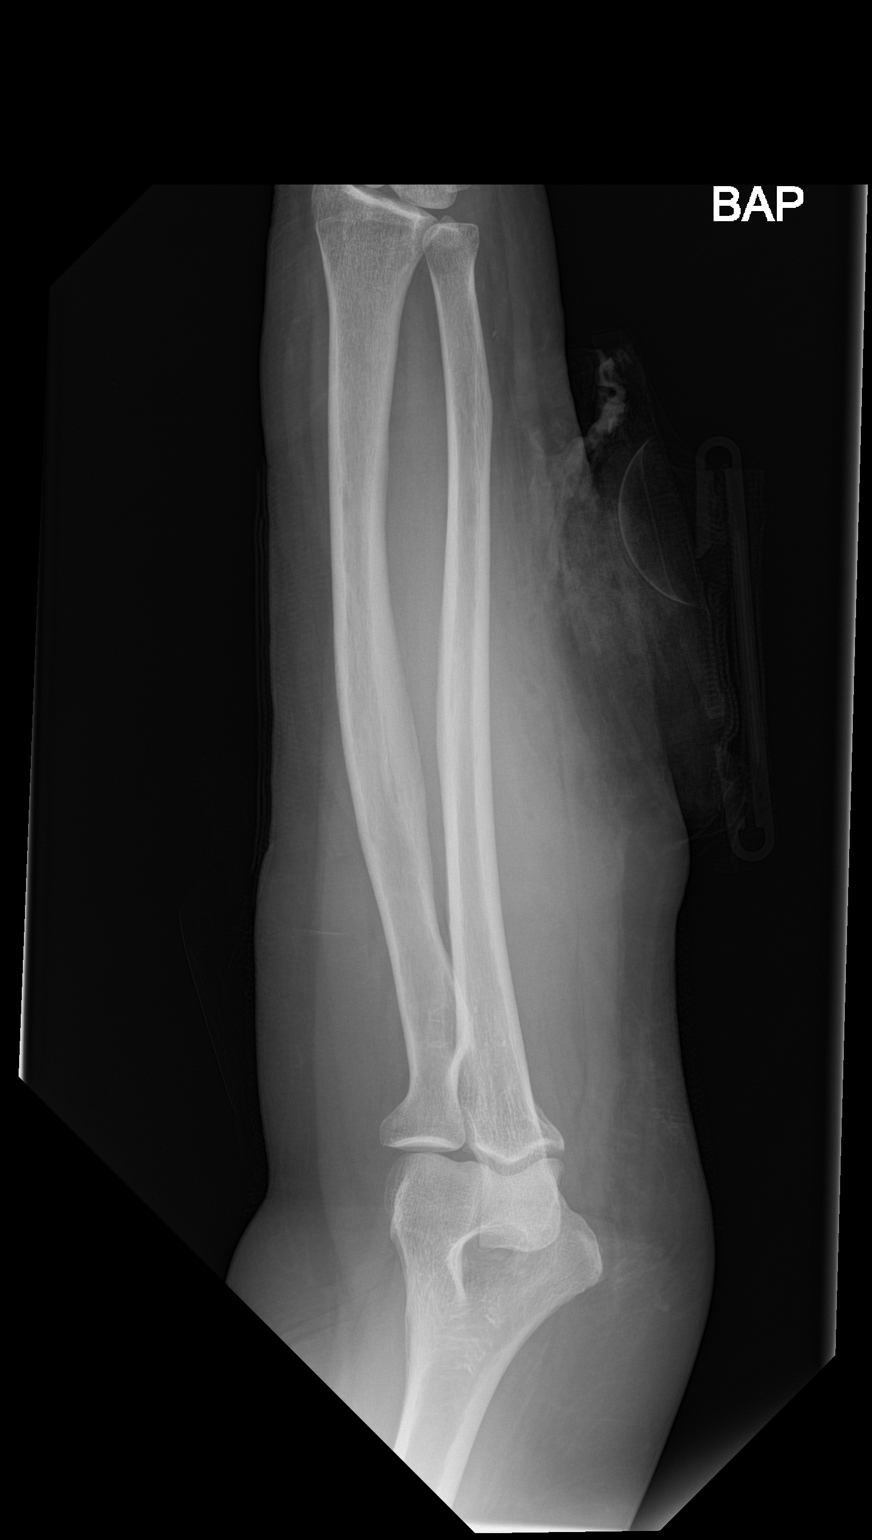

[forearm lat]
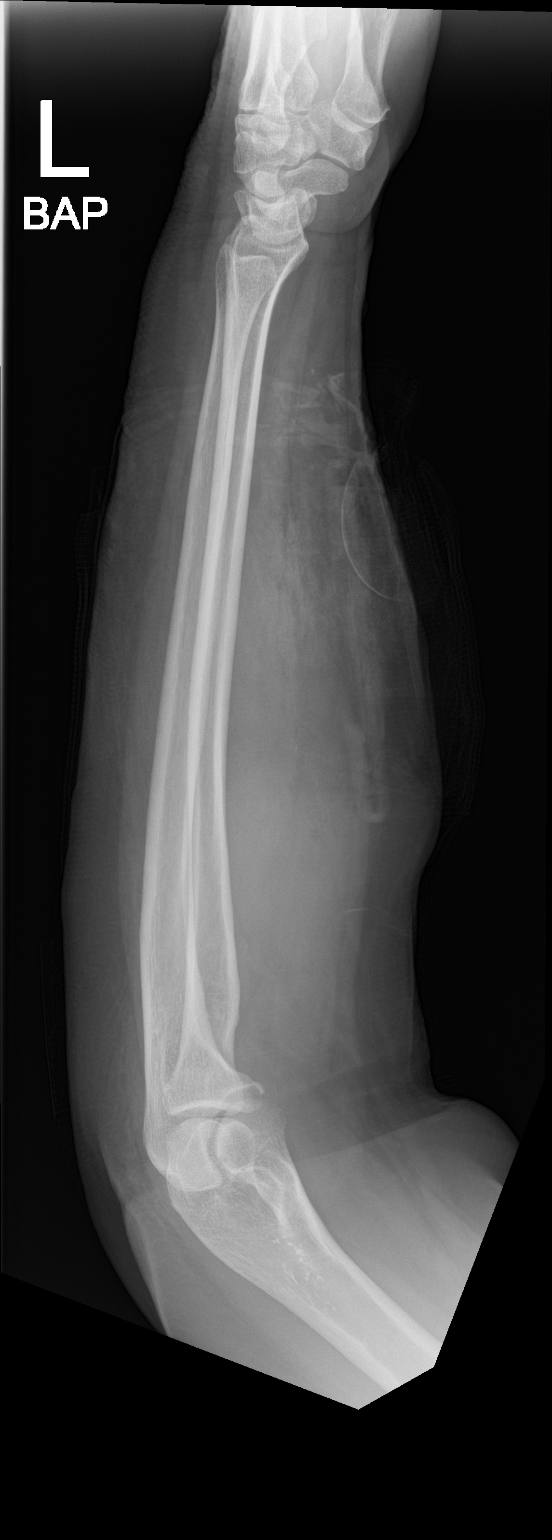

[2 of 2 positions shown; findings below may reference images not displayed]

FINDINGS: No fracture or malalignment. Laceration volar aspect of the forearm.
Possible punctate soft tissue foreign body distal forearm, volar and
ulnar side.
IMPRESSION: 1. No fracture or malalignment
2. Possible punctate soft tissue foreign body within the distal
forearm, volar and ulnar side.

## 2020-06-08 DIAGNOSIS — M79675 Pain in left toe(s): Secondary | ICD-10-CM | POA: Diagnosis not present

## 2020-06-08 DIAGNOSIS — E119 Type 2 diabetes mellitus without complications: Secondary | ICD-10-CM | POA: Diagnosis not present

## 2020-06-08 DIAGNOSIS — I10 Essential (primary) hypertension: Secondary | ICD-10-CM | POA: Diagnosis not present

## 2020-06-08 DIAGNOSIS — R0989 Other specified symptoms and signs involving the circulatory and respiratory systems: Secondary | ICD-10-CM | POA: Diagnosis not present

## 2020-06-10 DIAGNOSIS — M79675 Pain in left toe(s): Secondary | ICD-10-CM | POA: Diagnosis not present

## 2020-06-10 DIAGNOSIS — R0989 Other specified symptoms and signs involving the circulatory and respiratory systems: Secondary | ICD-10-CM | POA: Diagnosis not present

## 2020-06-13 DIAGNOSIS — E7800 Pure hypercholesterolemia, unspecified: Secondary | ICD-10-CM | POA: Diagnosis not present

## 2020-06-13 DIAGNOSIS — M79675 Pain in left toe(s): Secondary | ICD-10-CM | POA: Diagnosis not present

## 2020-06-13 DIAGNOSIS — E119 Type 2 diabetes mellitus without complications: Secondary | ICD-10-CM | POA: Diagnosis not present

## 2020-06-13 DIAGNOSIS — R0989 Other specified symptoms and signs involving the circulatory and respiratory systems: Secondary | ICD-10-CM | POA: Diagnosis not present

## 2020-06-13 DIAGNOSIS — I10 Essential (primary) hypertension: Secondary | ICD-10-CM | POA: Diagnosis not present

## 2020-08-15 DIAGNOSIS — R202 Paresthesia of skin: Secondary | ICD-10-CM | POA: Diagnosis not present

## 2020-08-15 DIAGNOSIS — M79642 Pain in left hand: Secondary | ICD-10-CM | POA: Diagnosis not present

## 2020-08-31 DIAGNOSIS — Z23 Encounter for immunization: Secondary | ICD-10-CM | POA: Diagnosis not present

## 2020-08-31 DIAGNOSIS — M2141 Flat foot [pes planus] (acquired), right foot: Secondary | ICD-10-CM | POA: Diagnosis not present

## 2020-08-31 DIAGNOSIS — M2142 Flat foot [pes planus] (acquired), left foot: Secondary | ICD-10-CM | POA: Diagnosis not present

## 2020-08-31 DIAGNOSIS — M722 Plantar fascial fibromatosis: Secondary | ICD-10-CM | POA: Diagnosis not present

## 2020-09-28 DIAGNOSIS — E0842 Diabetes mellitus due to underlying condition with diabetic polyneuropathy: Secondary | ICD-10-CM | POA: Diagnosis not present

## 2020-09-28 DIAGNOSIS — M722 Plantar fascial fibromatosis: Secondary | ICD-10-CM | POA: Diagnosis not present
